# Patient Record
Sex: Male | Born: 1955 | Race: Black or African American | Hispanic: No | Marital: Married | Smoking: Current every day smoker
Health system: Southern US, Community
[De-identification: ages and names within clinical notes are randomized; demographics above are authoritative.]

## PROBLEM LIST (undated history)

## (undated) DIAGNOSIS — K3184 Gastroparesis: Secondary | ICD-10-CM

## (undated) HISTORY — PX: KNEE SURGERY: SHX244

## (undated) HISTORY — PX: KNEE ARTHROSCOPY: SHX127

---

## 2010-12-29 ENCOUNTER — Emergency Department (HOSPITAL_COMMUNITY)
Admission: EM | Admit: 2010-12-29 | Discharge: 2010-12-30 | Disposition: A | Discharge status: Home / Self Care | Payer: 59 | Attending: Emergency Medicine | Admitting: Emergency Medicine

## 2010-12-29 ENCOUNTER — Encounter: Payer: Self-pay | Admitting: Emergency Medicine

## 2010-12-29 DIAGNOSIS — R6883 Chills (without fever): Secondary | ICD-10-CM | POA: Insufficient documentation

## 2010-12-29 DIAGNOSIS — R5381 Other malaise: Secondary | ICD-10-CM | POA: Insufficient documentation

## 2010-12-29 DIAGNOSIS — M549 Dorsalgia, unspecified: Secondary | ICD-10-CM | POA: Insufficient documentation

## 2010-12-29 DIAGNOSIS — R05 Cough: Secondary | ICD-10-CM | POA: Insufficient documentation

## 2010-12-29 DIAGNOSIS — A084 Viral intestinal infection, unspecified: Secondary | ICD-10-CM

## 2010-12-29 DIAGNOSIS — R059 Cough, unspecified: Secondary | ICD-10-CM | POA: Insufficient documentation

## 2010-12-29 DIAGNOSIS — R10817 Generalized abdominal tenderness: Secondary | ICD-10-CM | POA: Insufficient documentation

## 2010-12-29 DIAGNOSIS — A088 Other specified intestinal infections: Secondary | ICD-10-CM | POA: Insufficient documentation

## 2010-12-29 NOTE — ED Notes (Signed)
Pt presented to teh Er with c/o abd and lower back pain, states s/s started couple of days ago, denies any injury to the affected area, 10/10, pt reports chills, cold sweats, nausea, vomiting, 3-4 times, pt states took some OTC medications to relieve the symptoms, without succses, pt very uncomfortable and jittery at this time. Denies any other issue or complaints.

## 2010-12-30 ENCOUNTER — Emergency Department (HOSPITAL_COMMUNITY): Payer: 59

## 2010-12-30 LAB — COMPREHENSIVE METABOLIC PANEL
ALT: 22 U/L (ref 0–53)
Albumin: 4 g/dL (ref 3.5–5.2)
Alkaline Phosphatase: 66 U/L (ref 39–117)
BUN: 10 mg/dL (ref 6–23)
Chloride: 100 mEq/L (ref 96–112)
Potassium: 3.5 mEq/L (ref 3.5–5.1)
Total Bilirubin: 0.4 mg/dL (ref 0.3–1.2)

## 2010-12-30 LAB — CBC
HCT: 37.8 % — ABNORMAL LOW (ref 39.0–52.0)
Hemoglobin: 13.5 g/dL (ref 13.0–17.0)
RDW: 14.1 % (ref 11.5–15.5)
WBC: 17.6 10*3/uL — ABNORMAL HIGH (ref 4.0–10.5)

## 2010-12-30 LAB — URINALYSIS, ROUTINE W REFLEX MICROSCOPIC
Glucose, UA: NEGATIVE mg/dL
Leukocytes, UA: NEGATIVE
Protein, ur: NEGATIVE mg/dL
pH: 7.5 (ref 5.0–8.0)

## 2010-12-30 LAB — URINE MICROSCOPIC-ADD ON

## 2010-12-30 MED ORDER — ONDANSETRON HCL 4 MG/2ML IJ SOLN
4.0000 mg | Freq: Once | INTRAMUSCULAR | Status: AC
  Administered 2010-12-30: 4 mg via INTRAVENOUS
  Filled 2010-12-30: qty 2

## 2010-12-30 MED ORDER — PROMETHAZINE HCL 25 MG PO TABS: 25.0000 mg | ORAL_TABLET | Freq: Four times a day (QID) | ORAL | Status: AC | PRN

## 2010-12-30 MED ORDER — ONDANSETRON HCL 4 MG/2ML IJ SOLN
4.0000 mg | Freq: Once | INTRAMUSCULAR | Status: AC
  Administered 2010-12-30: 4 mg via INTRAVENOUS

## 2010-12-30 MED ORDER — SODIUM CHLORIDE 0.9 % IV SOLN
Freq: Once | INTRAVENOUS | Status: AC
  Administered 2010-12-30: via INTRAVENOUS

## 2010-12-30 MED ORDER — SODIUM CHLORIDE 0.9 % IV BOLUS (SEPSIS)
1000.0000 mL | Freq: Once | INTRAVENOUS | Status: AC
  Administered 2010-12-30: 1000 mL via INTRAVENOUS

## 2010-12-30 MED ORDER — ONDANSETRON HCL 4 MG/2ML IJ SOLN
INTRAMUSCULAR | Status: AC
  Administered 2010-12-30: 4 mg via INTRAVENOUS
  Filled 2010-12-30: qty 2

## 2010-12-30 MED ORDER — MORPHINE SULFATE 4 MG/ML IJ SOLN
4.0000 mg | Freq: Once | INTRAMUSCULAR | Status: AC
  Administered 2010-12-30: 4 mg via INTRAVENOUS
  Filled 2010-12-30: qty 1

## 2010-12-30 MED ORDER — HYDROCODONE-ACETAMINOPHEN 5-325 MG PO TABS: 1.0000 | ORAL_TABLET | ORAL | Status: AC | PRN

## 2010-12-30 NOTE — ED Provider Notes (Signed)
History     CSN: [id number]  Arrival date & time 12/29/10  2214   First MD Initiated Contact with Patient 12/30/10 0214      Chief Complaint  Patient presents with  . Abdominal Pain  . Back Pain      Patient is a 55 y.o. male presenting with abdominal pain and back pain. The history is provided by the patient.  Abdominal Pain The primary symptoms of the illness include abdominal pain, fatigue, nausea, vomiting and diarrhea. The current episode started 2 days ago. The onset of the illness was sudden. The problem has been gradually worsening.  The patient has had a change in bowel habit. Additional symptoms associated with the illness include constipation and back pain. Symptoms associated with the illness do not include frequency.  Back Pain  Associated symptoms include abdominal pain.   patient reports 2 day history of flulike symptoms. Symptoms include nausea, vomiting, diarrhea, generalized abdominal pain and low back pain. States had chills, unknown whether or not he's had fever. States he's had 5-6 episodes of vomiting last 24 hours in 2-3 episodes diarrhea. Admits that he's been around several coworkers and family members with similar symptoms.  History reviewed. No pertinent past medical history.  Past Surgical History  Procedure Date  . Knee arthroscopy     History reviewed. No pertinent family history.  History  Substance Use Topics  . Smoking status: Current Everyday Smoker  . Smokeless tobacco: Not on file  . Alcohol Use: No      Review of Systems  Constitutional: Positive for fatigue.  HENT: Negative.   Eyes: Negative.   Respiratory: Negative.   Cardiovascular: Negative.   Gastrointestinal: Positive for nausea, vomiting, abdominal pain, diarrhea and constipation.  Genitourinary: Negative.  Negative for frequency.  Musculoskeletal: Positive for back pain.  Skin: Negative.   Neurological: Negative.   Hematological: Negative.   Psychiatric/Behavioral:  Negative.     Allergies  Review of patient's allergies indicates no known allergies.  Home Medications  No current outpatient prescriptions on file.  BP 145/85  Pulse 86  Temp(Src) 98.2 F (36.8 C) (Oral)  Resp 20  SpO2 99%  Physical Exam  Constitutional: He appears well-developed and well-nourished.  HENT:  Head: Normocephalic and atraumatic.  Eyes: Conjunctivae are normal.  Neck: Neck supple.  Cardiovascular: Normal rate and regular rhythm.   Pulmonary/Chest: Effort normal and breath sounds normal.  Abdominal: Soft. Bowel sounds are decreased. There is generalized tenderness.  Musculoskeletal: Normal range of motion.  Neurological: He is alert.  Skin: Skin is warm and dry.  Psychiatric: He has a normal mood and affect.    ED Course  Procedures findings and clinical impression discussed with patient. Patient admits to feeling much better after IV hydration and medication. There have been no further episodes of vomiting or diarrhea since arrival to the department. Will plan for discharge home with short course of medication for nausea and pain and encourage pt to return for worsening symptoms, otherwise follow up w/ a primary doctor of his choosing. Referrals provided.  Labs Reviewed  URINALYSIS, ROUTINE W REFLEX MICROSCOPIC - Abnormal; Notable for the following:    APPearance TURBID (*)    Ketones, ur 40 (*)    All other components within normal limits  CBC - Abnormal; Notable for the following:    WBC 17.6 (*)    RBC 4.05 (*)    HCT 37.8 (*)    Platelets 470 (*)    All other components within  normal limits  COMPREHENSIVE METABOLIC PANEL - Abnormal; Notable for the following:    Glucose, Bld 136 (*)    All other components within normal limits  URINE MICROSCOPIC-ADD ON - Abnormal; Notable for the following:    Bacteria, UA MANY (*)    All other components within normal limits   Dg Chest 2 View  12/30/2010  *RADIOLOGY REPORT*  Clinical Data: Chills, cough.  CHEST  - 2 VIEW  Comparison: None.  Findings: Mild interstitial prominence.  No focal consolidation. No pleural effusion or pneumothorax. Cardiomediastinal contours are within normal limits.  No acute osseous abnormality.  IMPRESSION: Mild interstitial prominence without focal consolidation.  Original Report Authenticated By: Waneta Martins, M.D.     No diagnosis found.    MDM  HPI/PE and findings and clinical course most c/w viral gastroenteritis.        Leanne Chang, NP 12/30/10 [phone]

## 2010-12-30 NOTE — ED Provider Notes (Signed)
Medical screening examination/treatment/procedure(s) were conducted as a shared visit with non-physician practitioner(s) and myself.  I personally evaluated the patient during the encounter   Joya Gaskins, MD 12/30/10 0800

## 2010-12-30 NOTE — ED Provider Notes (Signed)
BP 145/85  Pulse 86  Temp(Src) 98.2 F (36.8 C) (Oral)  Resp 20  SpO2 99% PT IMPROVED ABD SOFT, AND NO FOCAL TENDERNESS WE DISCUSSED STRICT RETURN PRECAUTIONS INCLUDING WORSENED PAIN OR PAIN THAT MIGRATES TO RLQ OVER NEXT 8 HOURS   Joya Gaskins, MD 12/30/10 0430

## 2011-01-03 ENCOUNTER — Emergency Department (HOSPITAL_COMMUNITY)
Admission: EM | Admit: 2011-01-03 | Discharge: 2011-01-03 | Disposition: A | Discharge status: Home / Self Care | Payer: 59 | Attending: Emergency Medicine | Admitting: Emergency Medicine

## 2011-01-03 ENCOUNTER — Emergency Department (HOSPITAL_COMMUNITY): Payer: 59

## 2011-01-03 ENCOUNTER — Encounter (HOSPITAL_COMMUNITY): Payer: Self-pay | Admitting: *Deleted

## 2011-01-03 DIAGNOSIS — R109 Unspecified abdominal pain: Secondary | ICD-10-CM | POA: Insufficient documentation

## 2011-01-03 DIAGNOSIS — R197 Diarrhea, unspecified: Secondary | ICD-10-CM | POA: Insufficient documentation

## 2011-01-03 DIAGNOSIS — F17200 Nicotine dependence, unspecified, uncomplicated: Secondary | ICD-10-CM | POA: Insufficient documentation

## 2011-01-03 DIAGNOSIS — R112 Nausea with vomiting, unspecified: Secondary | ICD-10-CM | POA: Insufficient documentation

## 2011-01-03 DIAGNOSIS — K529 Noninfective gastroenteritis and colitis, unspecified: Secondary | ICD-10-CM

## 2011-01-03 DIAGNOSIS — K5289 Other specified noninfective gastroenteritis and colitis: Secondary | ICD-10-CM | POA: Insufficient documentation

## 2011-01-03 LAB — COMPREHENSIVE METABOLIC PANEL
ALT: 21 U/L (ref 0–53)
Alkaline Phosphatase: 65 U/L (ref 39–117)
BUN: 15 mg/dL (ref 6–23)
Chloride: 101 mEq/L (ref 96–112)
GFR calc Af Amer: >90 mL/min (ref >90)
Glucose, Bld: 122 mg/dL — ABNORMAL HIGH (ref 70–99)
Potassium: 4.1 mEq/L (ref 3.5–5.1)
Sodium: 134 mEq/L — ABNORMAL LOW (ref 135–145)
Total Bilirubin: 0.6 mg/dL (ref 0.3–1.2)

## 2011-01-03 LAB — DIFFERENTIAL
Basophils Relative: 0 % (ref 0–1)
Eosinophils Relative: 0 % (ref 0–5)
Lymphocytes Relative: 19 % (ref 12–46)
Monocytes Relative: 10 % (ref 3–12)
Neutro Abs: 8.1 10*3/uL — ABNORMAL HIGH (ref 1.7–7.7)

## 2011-01-03 LAB — LIPASE, BLOOD: Lipase: 18 U/L (ref 11–59)

## 2011-01-03 LAB — CBC
HCT: 39.5 % (ref 39.0–52.0)
Hemoglobin: 14.1 g/dL (ref 13.0–17.0)
MCHC: 35.7 g/dL (ref 30.0–36.0)
RBC: 4.28 MIL/uL (ref 4.22–5.81)
WBC: 11.3 10*3/uL — ABNORMAL HIGH (ref 4.0–10.5)

## 2011-01-03 MED ORDER — HYDROMORPHONE HCL PF 1 MG/ML IJ SOLN
1.0000 mg | Freq: Once | INTRAMUSCULAR | Status: AC
  Administered 2011-01-03: 1 mg via INTRAVENOUS
  Filled 2011-01-03: qty 1

## 2011-01-03 MED ORDER — METOCLOPRAMIDE HCL 5 MG/ML IJ SOLN
INTRAMUSCULAR | Status: AC
  Administered 2011-01-03: 10 mg
  Filled 2011-01-03: qty 2

## 2011-01-03 MED ORDER — IOHEXOL 300 MG/ML  SOLN
100.0000 mL | Freq: Once | INTRAMUSCULAR | Status: AC | PRN
  Administered 2011-01-03: 100 mL via INTRAVENOUS

## 2011-01-03 MED ORDER — PANTOPRAZOLE SODIUM 40 MG IV SOLR
40.0000 mg | Freq: Once | INTRAVENOUS | Status: AC
  Administered 2011-01-03: 40 mg via INTRAVENOUS
  Filled 2011-01-03: qty 40

## 2011-01-03 MED ORDER — ONDANSETRON HCL 4 MG/2ML IJ SOLN
4.0000 mg | Freq: Once | INTRAMUSCULAR | Status: AC
Start: 2011-01-03 — End: 2011-01-03
  Administered 2011-01-03: 4 mg via INTRAVENOUS
  Filled 2011-01-03: qty 2

## 2011-01-03 MED ORDER — PANTOPRAZOLE SODIUM 20 MG PO TBEC: 20.0000 mg | DELAYED_RELEASE_TABLET | Freq: Every day | ORAL | Status: DC

## 2011-01-03 MED ORDER — HYDROCODONE-ACETAMINOPHEN 5-325 MG PO TABS: 1.0000 | ORAL_TABLET | Freq: Four times a day (QID) | ORAL | Status: AC | PRN

## 2011-01-03 MED ORDER — SODIUM CHLORIDE 0.9 % IV BOLUS (SEPSIS)
1000.0000 mL | Freq: Once | INTRAVENOUS | Status: AC
  Administered 2011-01-03: 1000 mL via INTRAVENOUS

## 2011-01-03 MED ORDER — PROMETHAZINE HCL 25 MG PO TABS: 25.0000 mg | ORAL_TABLET | Freq: Four times a day (QID) | ORAL | Status: AC | PRN

## 2011-01-03 NOTE — ED Notes (Signed)
Patient given discharge instructions, information, prescriptions, and diet order. Patient states that they adequately understand discharge information given and to return to ED if symptoms return or worsen.     

## 2011-01-03 NOTE — ED Notes (Signed)
Family at bedside. 

## 2011-01-03 NOTE — ED Notes (Signed)
Patient is resting comfortably. Eyes closed with deep, even respirations

## 2011-01-03 NOTE — ED Notes (Signed)
Pts wife reports pt was seen here last Thursday with same symptoms, dx with stomach virus. Sent home with pain and nausea meds. Sts was doing well until last night, symptoms returned, out of pain meds. Took nausea med this am, has vomited several times this am. C/o generalized abd pain.

## 2011-01-03 NOTE — ED Provider Notes (Signed)
History     CSN: [id number]  Arrival date & time 01/03/11  0920   First MD Initiated Contact with Patient 01/03/11 [phone]      Chief Complaint  Patient presents with  . Emesis  . Abdominal Pain    (Consider location/radiation/quality/duration/timing/severity/associated sxs/prior treatment) Patient is a 55 y.o. male presenting with abdominal pain. The history is provided by the patient (The patient states that he started with abdominal cramping and diarrhea with nausea today. He had a similar episode 3 days ago which has been improving with treatment. Patient started back on solid foods today and now has severe cramping).  Abdominal Pain The primary symptoms of the illness include abdominal pain and diarrhea. The primary symptoms of the illness do not include fever, fatigue or shortness of breath. The current episode started more than 2 days ago. The onset of the illness was gradual. The problem has not changed since onset. The abdominal pain began more than 2 days ago. The pain came on gradually. The abdominal pain has been unchanged since its onset. The abdominal pain is generalized. The abdominal pain does not radiate. The severity of the abdominal pain is 7/10. The abdominal pain is relieved by nothing.  Symptoms associated with the illness do not include hematuria, frequency or back pain.    History reviewed. No pertinent past medical history.  Past Surgical History  Procedure Date  . Knee arthroscopy     No family history on file.  History  Substance Use Topics  . Smoking status: Current Everyday Smoker  . Smokeless tobacco: Not on file  . Alcohol Use: No      Review of Systems  Constitutional: Negative for fever and fatigue.  HENT: Negative for congestion, sinus pressure and ear discharge.   Eyes: Negative for discharge.  Respiratory: Negative for cough and shortness of breath.   Cardiovascular: Negative for chest pain.  Gastrointestinal: Positive for abdominal  pain and diarrhea.  Genitourinary: Negative for frequency and hematuria.  Musculoskeletal: Negative for back pain.  Skin: Negative for rash.  Neurological: Negative for seizures and headaches.  Hematological: Negative.   Psychiatric/Behavioral: Negative for hallucinations.    Allergies  Review of patient's allergies indicates no known allergies.  Home Medications   Current Outpatient Rx  Name Route Sig Dispense Refill  . HYDROCODONE-ACETAMINOPHEN 5-325 MG PO TABS Oral Take 1 tablet by mouth every 4 (four) hours as needed for pain (1 to 2 tabs PO q4-6 h PRN pain). 15 tablet 0  . PROMETHAZINE HCL 25 MG PO TABS Oral Take 1 tablet (25 mg total) by mouth every 6 (six) hours as needed for nausea. 10 tablet 0  . HYDROCODONE-ACETAMINOPHEN 5-325 MG PO TABS Oral Take 1 tablet by mouth every 6 (six) hours as needed for pain. 20 tablet 0  . PANTOPRAZOLE SODIUM 20 MG PO TBEC Oral Take 1 tablet (20 mg total) by mouth daily. 30 tablet 0  . PROMETHAZINE HCL 25 MG PO TABS Oral Take 1 tablet (25 mg total) by mouth every 6 (six) hours as needed for nausea. 15 tablet 0    BP 142/93  Pulse 86  Temp(Src) 98.2 F (36.8 C) (Oral)  Resp 18  SpO2 96%  Physical Exam  Constitutional: He is oriented to person, place, and time. He appears well-developed.  HENT:  Head: Normocephalic and atraumatic.  Eyes: Conjunctivae and EOM are normal. No scleral icterus.  Neck: Neck supple. No thyromegaly present.  Cardiovascular: Normal rate and regular rhythm.  Exam reveals no  gallop and no friction rub.   No murmur heard. Pulmonary/Chest: No stridor. He has no wheezes. He has no rales. He exhibits no tenderness.  Abdominal: He exhibits distension. There is tenderness. There is no rebound.  Musculoskeletal: Normal range of motion. He exhibits no edema.  Lymphadenopathy:    He has no cervical adenopathy.  Neurological: He is oriented to person, place, and time. Coordination normal.  Skin: No rash noted. No erythema.   Psychiatric: He has a normal mood and affect. His behavior is normal.    ED Course  Procedures (including critical care time)  Labs Reviewed  CBC - Abnormal; Notable for the following:    WBC 11.3 (*)    Platelets 556 (*)    All other components within normal limits  DIFFERENTIAL - Abnormal; Notable for the following:    Neutro Abs 8.1 (*)    Monocytes Absolute 1.1 (*)    All other components within normal limits  COMPREHENSIVE METABOLIC PANEL - Abnormal; Notable for the following:    Sodium 134 (*)    Glucose, Bld 122 (*)    All other components within normal limits  LIPASE, BLOOD   Ct Abdomen Pelvis W Contrast  01/03/2011  *RADIOLOGY REPORT*  Clinical Data: Abdominal pain.  Vomiting.  CT ABDOMEN AND PELVIS WITH CONTRAST  Technique:  Multidetector CT imaging of the abdomen and pelvis was performed following the standard protocol during bolus administration of intravenous contrast.  Contrast: OMNIPAQUE IOHEXOL 300 MG/ML IV SOLN  Comparison: None.  Findings: The abdominal parenchymal organs are normal in appearance.  Gallbladder is unremarkable.  No evidence of hydronephrosis.  No soft tissue masses or lymphadenopathy identified within the abdomen or pelvis.  There is no evidence of inflammatory process or abnormal fluid collections.  Normal appendix is visualized.  No evidence of bowel wall thickening or dilatation.  IMPRESSION: Negative.  No acute findings or other significant abnormality.  Original Report Authenticated By: Danae Orleans, M.D.     1. Gastroenteritis       MDM          Benny Lennert, MD 01/03/11 [phone]

## 2011-04-03 ENCOUNTER — Emergency Department (HOSPITAL_COMMUNITY)
Admission: EM | Admit: 2011-04-03 | Discharge: 2011-04-03 | Disposition: A | Discharge status: Home / Self Care | Payer: 59 | Attending: Emergency Medicine | Admitting: Emergency Medicine

## 2011-04-03 ENCOUNTER — Encounter (HOSPITAL_COMMUNITY): Payer: Self-pay | Admitting: *Deleted

## 2011-04-03 DIAGNOSIS — K5289 Other specified noninfective gastroenteritis and colitis: Secondary | ICD-10-CM | POA: Insufficient documentation

## 2011-04-03 DIAGNOSIS — K529 Noninfective gastroenteritis and colitis, unspecified: Secondary | ICD-10-CM

## 2011-04-03 DIAGNOSIS — R5381 Other malaise: Secondary | ICD-10-CM | POA: Insufficient documentation

## 2011-04-03 HISTORY — DX: Gastroparesis: K31.84

## 2011-04-03 LAB — COMPREHENSIVE METABOLIC PANEL
ALT: 23 U/L (ref 0–53)
Calcium: 9.2 mg/dL (ref 8.4–10.5)
GFR calc Af Amer: >90 mL/min (ref >90)
Glucose, Bld: 127 mg/dL — ABNORMAL HIGH (ref 70–99)
Sodium: 136 mEq/L (ref 135–145)
Total Protein: 7.9 g/dL (ref 6.0–8.3)

## 2011-04-03 MED ORDER — SODIUM CHLORIDE 0.9 % IV BOLUS (SEPSIS)
2000.0000 mL | Freq: Once | INTRAVENOUS | Status: AC
  Administered 2011-04-03 (×2): 1000 mL via INTRAVENOUS

## 2011-04-03 MED ORDER — HYDROMORPHONE HCL PF 1 MG/ML IJ SOLN
1.0000 mg | Freq: Once | INTRAMUSCULAR | Status: AC
  Administered 2011-04-03: 1 mg via INTRAVENOUS
  Filled 2011-04-03: qty 1

## 2011-04-03 MED ORDER — ONDANSETRON HCL 4 MG PO TABS: 8.0000 mg | ORAL_TABLET | Freq: Two times a day (BID) | ORAL | Status: AC | PRN

## 2011-04-03 MED ORDER — ONDANSETRON HCL 4 MG/2ML IJ SOLN
4.0000 mg | Freq: Once | INTRAMUSCULAR | Status: AC
  Administered 2011-04-03: 4 mg via INTRAVENOUS
  Filled 2011-04-03: qty 2

## 2011-04-03 NOTE — ED Notes (Signed)
2nd liter of normal saline hung. Will notify Md when completed. Family at bedside. Will continue to monitor pt.

## 2011-04-03 NOTE — Discharge Instructions (Signed)
Drink lots of fluids avoid milk or foods containing milk such as ice cream while having diarrhea. You can take Imodium A-D as directed for diarrhea. Take the medication prescribed as needed for nausea. Return or see your doctor if unable to hold down fluids after taking medicine prescribed, return if her condition worsens for any reason

## 2011-04-03 NOTE — Progress Notes (Signed)
Pt confirms he does not have a PCP Has [employer] coverage Can find in network [facility]

## 2011-04-03 NOTE — ED Notes (Signed)
Pt reports generalized abd pain that began this am, feels similar to previous episode of gastroparesis. Unable to count episodes of vomiting in last 12 hours. A few episodes of diarrhea as well.

## 2011-04-03 NOTE — ED Provider Notes (Signed)
History     CSN: [id number]  Arrival date & time 04/03/11  1505   First MD Initiated Contact with Patient 04/03/11 1648      Chief Complaint  Patient presents with  . Emesis  . Nausea  . Abdominal Pain    (Consider location/radiation/quality/duration/timing/severity/associated sxs/prior treatment) HPI Complains of diffuse sharp abdominal pain gradual onset 3 AM today accompanied by multiple episodes of vomiting and multiple episodes of diarrhea. Pain is constant nothing makes symptoms better or worse. No fever no blood per rectum no hematemesis no treatment prior to coming here. Feels dehydrated No other associated symptoms Past Medical History  Diagnosis Date  . Gastroparesis    gastroenteritis  Past Surgical History  Procedure Date  . Knee arthroscopy   . Knee surgery     No family history on file.  History  Substance Use Topics  . Smoking status: Current Everyday Smoker  . Smokeless tobacco: Not on file  . Alcohol Use: Yes     occasionally      Review of Systems  Constitutional: Positive for fatigue.  Gastrointestinal: Positive for nausea, vomiting, abdominal pain and diarrhea.  All other systems reviewed and are negative.    Allergies  Review of patient's allergies indicates no known allergies.  Home Medications   Current Outpatient Rx  Name Route Sig Dispense Refill  . PROMETHAZINE HCL 25 MG PO TABS Oral Take 25 mg by mouth every 6 (six) hours as needed. For nausea      BP 180/94  Pulse 63  Temp(Src) 97.9 F (36.6 C) (Oral)  Resp 22  SpO2 100%  Physical Exam  Nursing note and vitals reviewed. Constitutional: He appears well-developed and well-nourished.  HENT:  Head: Normocephalic and atraumatic.       Mucous membranes dry  Eyes: Conjunctivae are normal. Pupils are equal, round, and reactive to light.  Neck: Neck supple. No tracheal deviation present. No thyromegaly present.  Cardiovascular: Normal rate and regular rhythm.   No murmur  heard. Pulmonary/Chest: Effort normal and breath sounds normal.  Abdominal: Soft. Bowel sounds are normal. He exhibits no distension. There is no tenderness.  Musculoskeletal: Normal range of motion. He exhibits no edema and no tenderness.  Neurological: He is alert. Coordination normal.  Skin: Skin is warm and dry. No rash noted.  Psychiatric: He has a normal mood and affect.    ED Course  Procedures (including critical care time)  Labs Reviewed - No data to display No results found. Results for orders placed during the hospital encounter of 04/03/11  COMPREHENSIVE METABOLIC PANEL      Component Value Range   Sodium 136  135 - 145 (mEq/L)   Potassium 3.5  3.5 - 5.1 (mEq/L)   Chloride 101  96 - 112 (mEq/L)   CO2 25  19 - 32 (mEq/L)   Glucose, Bld 127 (*) 70 - 99 (mg/dL)   BUN 17  6 - 23 (mg/dL)   Creatinine, Ser 4.09  0.50 - 1.35 (mg/dL)   Calcium 9.2  8.4 - 81.1 (mg/dL)   Total Protein 7.9  6.0 - 8.3 (g/dL)   Albumin 4.0  3.5 - 5.2 (g/dL)   AST 28  0 - 37 (U/L)   ALT 23  0 - 53 (U/L)   Alkaline Phosphatase 75  39 - 117 (U/L)   Total Bilirubin 0.5  0.3 - 1.2 (mg/dL)   GFR calc non Af Amer >90  >90 (mL/min)   GFR calc Af Amer >90  >90 (  mL/min)   No results found.   No diagnosis found.  5:25 PM feels much improved after treatment with narcotic pain medicine and Zofran and intravenous fluids 10:20 PM patient feels ready to home MDM  Plan prescriptions Zofran,imodiumad prn diarrhea Encourage by mouth fluids Dx#1. Gastroenteritis #2 dehydration        Doug Sou, MD 04/03/11 2224

## 2011-04-05 ENCOUNTER — Emergency Department (HOSPITAL_COMMUNITY)
Admission: EM | Admit: 2011-04-05 | Discharge: 2011-04-05 | Disposition: A | Discharge status: Home / Self Care | Payer: 59 | Attending: Emergency Medicine | Admitting: Emergency Medicine

## 2011-04-05 ENCOUNTER — Encounter (HOSPITAL_COMMUNITY): Payer: Self-pay

## 2011-04-05 DIAGNOSIS — R109 Unspecified abdominal pain: Secondary | ICD-10-CM

## 2011-04-05 DIAGNOSIS — R1110 Vomiting, unspecified: Secondary | ICD-10-CM | POA: Insufficient documentation

## 2011-04-05 DIAGNOSIS — R10819 Abdominal tenderness, unspecified site: Secondary | ICD-10-CM | POA: Insufficient documentation

## 2011-04-05 DIAGNOSIS — I10 Essential (primary) hypertension: Secondary | ICD-10-CM | POA: Insufficient documentation

## 2011-04-05 LAB — POCT I-STAT, CHEM 8
BUN: 17 mg/dL (ref 6–23)
Chloride: 106 mEq/L (ref 96–112)
Creatinine, Ser: 1.2 mg/dL (ref 0.50–1.35)
Glucose, Bld: 120 mg/dL — ABNORMAL HIGH (ref 70–99)
Hemoglobin: 16 g/dL (ref 13.0–17.0)
Potassium: 3.8 mEq/L (ref 3.5–5.1)

## 2011-04-05 LAB — CBC
HCT: 44.1 % (ref 39.0–52.0)
Hemoglobin: 15.2 g/dL (ref 13.0–17.0)
MCHC: 34.5 g/dL (ref 30.0–36.0)

## 2011-04-05 MED ORDER — ONDANSETRON 4 MG PO TBDP: 4.0000 mg | ORAL_TABLET | Freq: Three times a day (TID) | ORAL | Status: AC | PRN

## 2011-04-05 MED ORDER — OXYCODONE-ACETAMINOPHEN 5-325 MG PO TABS: 1.0000 | ORAL_TABLET | ORAL | Status: AC | PRN

## 2011-04-05 MED ORDER — HYDROMORPHONE HCL PF 1 MG/ML IJ SOLN
1.0000 mg | Freq: Once | INTRAMUSCULAR | Status: AC
  Administered 2011-04-05: 1 mg via INTRAVENOUS
  Filled 2011-04-05: qty 1

## 2011-04-05 MED ORDER — SODIUM CHLORIDE 0.9 % IV BOLUS (SEPSIS)
1000.0000 mL | Freq: Once | INTRAVENOUS | Status: AC
  Administered 2011-04-05 (×2): 1000 mL via INTRAVENOUS

## 2011-04-05 MED ORDER — HYDROMORPHONE HCL PF 1 MG/ML IJ SOLN
INTRAMUSCULAR | Status: AC
  Filled 2011-04-05: qty 1

## 2011-04-05 MED ORDER — ONDANSETRON HCL 4 MG/2ML IJ SOLN
4.0000 mg | Freq: Once | INTRAMUSCULAR | Status: AC
  Administered 2011-04-05: 4 mg via INTRAVENOUS
  Filled 2011-04-05: qty 2

## 2011-04-05 MED ORDER — SODIUM CHLORIDE 0.9 % IV BOLUS (SEPSIS)
1000.0000 mL | Freq: Once | INTRAVENOUS | Status: AC
  Administered 2011-04-05: 1000 mL via INTRAVENOUS

## 2011-04-05 MED ORDER — HYDROMORPHONE HCL PF 1 MG/ML IJ SOLN
1.0000 mg | Freq: Once | INTRAMUSCULAR | Status: AC
  Administered 2011-04-05: 1 mg via INTRAVENOUS

## 2011-04-05 MED ORDER — ENALAPRILAT 1.25 MG/ML IV SOLN
1.2500 mg | Freq: Once | INTRAVENOUS | Status: AC
  Administered 2011-04-05: 1.25 mg via INTRAVENOUS
  Filled 2011-04-05: qty 1

## 2011-04-05 MED ORDER — PROMETHAZINE HCL 25 MG PO TABS: 25.0000 mg | ORAL_TABLET | Freq: Four times a day (QID) | ORAL | Status: AC | PRN

## 2011-04-05 NOTE — Discharge Instructions (Signed)
Your blood pressure has been elevated and required a blood pressure medicine today - please continue to take the Lisinopril until you follow up with your doctor this week.  If you don't have a doctor see the list below - return to ER for recheck if you are not improving or worsening.  You have been diagnosed with undifferentiated abdominal pain.  Abdominal pain can be caused by many things. Your caregiver evaluates the seriousness of your pain by an examination and possibly blood or urine tests and imaging (CT scan, x-rays, ultrasound). Many cases can be observed and treated at home after initial evaluation in the emergency department. Even though you are being discharged home, abdominal pain can be unpredictable. Therefore, you need a repeat exam if your pain does not resolve, returns, or worsens. Most patient's with abdominal pain do not need to be admitted to the hospital or have surgery, but serious problems like appendicitis and gallbladder attacks can start out as nonspecific pain. Many abdominal conditions cannot be diagnosed in 1 visit, so followup evaluations are very important.  Seek immediate medical attention if:  *The pain does not go away or becomes severe. *Temperature above 101 develops *Repeated vomiting occurs(multiple episodes) *The pain becomes localized to portions of the abdomen. The right side could possibly be appendicitis. In an adult, the left lower portion of the abdomen could be colitis or diverticulitis. *Blood is being passed in stools or vomit *Return also if you develop chest pain, difficulty breathing, dizziness or fainting, or become confused poorly responsive or inconsolable (young children).     If you do not have a physician, you should reference the below phone numbers and call in the morning to establish follow up care.  RESOURCE GUIDE  Dental Problems  Patients with Medicaid: [facility] [phone]                                           [phone] [address] Phone:  [phone]                                                  Phone:  [phone]  If unable to pay or uninsured, contact:  [facility] or [facility]. to become qualified for the adult dental clinic.  Chronic Pain Problems Contact [facility]  [phone] Patients need to be referred by their primary care doctor.  Insufficient Money for Medicine Contact [employer]:  call "211" or [facility] ([phone] [phone].  No Primary Care Doctor Call [facility]  [phone] Other agencies that provide inexpensive medical care    [facility]  [phone]    [facility]  [phone]    [facility]  [phone]    [facility]  [phone]    [facility]  3134157994    [facility]  [phone]  Psychological Services [facility]  [phone] [facility]  [phone] [facility]   3202611858 (emergency services 5157534792)  Substance Abuse Resources [facility]  [phone] [facility]  239-632-2448 [facility] 5790253802 Floydene Flock [phone] Residential & Outpatient Substance Abuse Program  5401306937  Abuse/Neglect [facility] Child Abuse Hotline (720) 256-3521 [facility] Child Abuse Hotline 5794119947 (After Hours)  Emergency Shelter [facility] [phone]  M[facility] [place] [facility] (680)811-3669 [facility] (616)356-6833  MRSA Hotline #:   (250)040-5906    [facility] Resources  [facility]     [employer]                          [facility] Dept. [address] [place]                       [address]      [address]  Blondell Reveal Phone:   [phone]                                   Phone:  [phone]                 Phone:  [phone]  [facility] Phone:  ([phone]  [facility] Child Abuse Hotline 734-519-8597 [phone] (After Hours)

## 2011-04-05 NOTE — ED Provider Notes (Signed)
Patient was reassessed and feeling better. He was discharged home in good condition with discharge instructions are to be completed by Dr. Hyacinth Meeker.  1. Abdominal  pain, other specified site   2. Vomiting   3. Hypertension      Cyndra Numbers, MD 04/05/11 [phone]

## 2011-04-05 NOTE — ED Provider Notes (Signed)
History     CSN: [id number]  Arrival date & time 04/05/11  5621   First MD Initiated Contact with Patient 04/05/11 [phone]      Chief Complaint  Patient presents with  . Emesis    (Consider location/radiation/quality/duration/timing/severity/associated sxs/prior treatment) HPI Comments: Mario Woodard 56 y.o. male   Chief Complaint: Abdominal Pain  Information is obtained from : patient, spouse/SO and past medical records  Onset of the pain was gradual, Pain is located at diffuse abdomen, Timing persistent, Course worsened, Severity moderate, , Quality is aching and cramping, Alleviating/Agravating Their symptoms are aggravated by palpation., Symptoms improve with none., Patient notes no abdominal pain, diarrhea, nausea and vomiting. and Past history includes recent divertiulitis., No significant heartburn, No hematemesis, No blood in stools or black tarry stools, No dysphagia, Positive for diarrhea watery, non bloody, vomiting twice this evening   Prior Surgery: no surgeries  Use of Steroids No Use of NSAIDs No Alcohol intake No   Seen in the ED 2 night ago and has improvement in sx of n/v/d adn cramping - had good day the next day and then it returned last night.    Patient is a 56 y.o. male presenting with vomiting. The history is provided by the patient, a relative and medical records.  Emesis     Past Medical History  Diagnosis Date  . Gastroparesis     Past Surgical History  Procedure Date  . Knee arthroscopy   . Knee surgery     History reviewed. No pertinent family history.  History  Substance Use Topics  . Smoking status: Current Everyday Smoker  . Smokeless tobacco: Not on file  . Alcohol Use: Yes     occasionally      Review of Systems  Gastrointestinal: Positive for vomiting.  All other systems reviewed and are negative.    Allergies  Review of patient's allergies indicates no known allergies.  Home Medications   Current Outpatient Rx    Name Route Sig Dispense Refill  . ONDANSETRON HCL 4 MG PO TABS Oral Take 2 tablets (8 mg total) by mouth every 12 (twelve) hours as needed for nausea. 20 tablet 0  . PROMETHAZINE HCL 25 MG PO TABS Oral Take 25 mg by mouth every 6 (six) hours as needed. For nausea      BP 180/108  Pulse 88  Temp(Src) 97.7 F (36.5 C) (Oral)  Resp 18  SpO2 100%  Physical Exam  Nursing note and vitals reviewed. Constitutional: He appears well-developed and well-nourished.       Uncomfortable appearing  HENT:  Head: Normocephalic and atraumatic.  Mouth/Throat: No oropharyngeal exudate.       MM mildly dehydrated  Eyes: Conjunctivae and EOM are normal. Pupils are equal, round, and reactive to light. Right eye exhibits no discharge. Left eye exhibits no discharge. No scleral icterus.  Neck: Normal range of motion. Neck supple. No JVD present. No thyromegaly present.  Cardiovascular: Normal rate, regular rhythm, normal heart sounds and intact distal pulses.  Exam reveals no gallop and no friction rub.   No murmur heard. Pulmonary/Chest: Effort normal and breath sounds normal. No respiratory distress. He has no wheezes. He has no rales.  Abdominal: Soft. He exhibits no distension and no mass. There is tenderness ( mild diffuse ttp without gurading, rebound or peritoneal signs).       Increased BS  Musculoskeletal: Normal range of motion. He exhibits no edema and no tenderness.  Lymphadenopathy:    He has no  cervical adenopathy.  Neurological: He is alert. Coordination normal.  Skin: Skin is warm and dry. No rash noted. No erythema.  Psychiatric: He has a normal mood and affect. His behavior is normal.    ED Course  Procedures (including critical care time)   Labs Reviewed  CBC   No results found.   No diagnosis found.    MDM  abd cramping and recurrent n/v/d.  Check labs, ua for hydration status, IVF, zofran, Dilaudid.  Pt has been reevaluated - abd with mild pain - has improved with  meds, has fluids given, 2nd liter of IVF being given at this time.  Pt has had persistently elevated BP - is currently 178 / 108 - he has no hx of htn - will start meds - ACE inhibitor, monitor for improvement, and anticipate d/c with f/u.  Change of shift - care signed out to Dr. Alto Denver.        Vida Roller, MD 04/05/11 [phone]

## 2011-04-05 NOTE — ED Notes (Signed)
Pt was seen here on Monday and was treated and discharged, did ok on Tuesday, sore from previous vomiting, started vomiting again tonight, pt currently vomiting in triage

## 2011-05-01 ENCOUNTER — Encounter: Payer: Self-pay | Admitting: Internal Medicine

## 2011-05-01 ENCOUNTER — Ambulatory Visit (INDEPENDENT_AMBULATORY_CARE_PROVIDER_SITE_OTHER): Payer: 59 | Admitting: Internal Medicine

## 2011-05-01 DIAGNOSIS — Z0000 Encounter for general adult medical examination without abnormal findings: Secondary | ICD-10-CM

## 2011-05-01 DIAGNOSIS — Z23 Encounter for immunization: Secondary | ICD-10-CM

## 2011-05-01 MED ORDER — AMLODIPINE BESYLATE 5 MG PO TABS: 5.0000 mg | ORAL_TABLET | Freq: Every day | ORAL | Status: DC

## 2011-05-01 NOTE — Patient Instructions (Addendum)
Come back fasting: FLP, CMP, TSH, CBC, PSA--- dx v70 Start your blood pressure medication Check the  blood pressure 2 or 3 times a week, be sure it is between 110/60 and 140/85. If it is consistently higher or lower , let me know

## 2011-05-01 NOTE — Progress Notes (Signed)
  Subjective:    Patient ID: Mario Woodard, male    DOB: 1955-02-15, 56 y.o.   MRN: [id number]  HPI New pt, CPX, here w/ his wife   Past medical history Elevated BP dx 03-2011  Past surgical history Arthroscopy, R knee ~ 2000  Social history Married, children x 2, one with current wife, 1 G child  Tobacco--  ~ 1ppd since ~ 1990 ETOH-- socially  Job-- Health visitor , [employer], works nights Diet-- regular to healthy  Exercise -- very active at work  Family history Diabetes-- M HTN-- M Asthma-- M CAD-- no Stroke-- no Colon cancer-- no Prostate cancer-- F dx at age 2   Review of Systems  Constitutional: Negative for fever.       Low sex drive lately , some fatigue after a GI infex 03-2011   Respiratory: Negative for cough and shortness of breath.   Cardiovascular: Negative for chest pain, palpitations and leg swelling.  Genitourinary: Negative for dysuria and hematuria.  Musculoskeletal:       L shoulder-neck pain ~ 2 weeks ago, no SSCP  Psychiatric/Behavioral:       Some stress d/t issues w/ daughter . No depression-anxiety       Objective:   Physical Exam General -- alert, well-developed, and well-nourished.   Neck --no thyromegaly , normal carotid pulse Lungs -- normal respiratory effort, no intercostal retractions, no accessory muscle use, and normal breath sounds.   Heart-- normal rate, regular rhythm, no murmur, and no gallop.   Abdomen--soft, non-tender, no distention, no masses, no HSM, no guarding, and no rigidity.   Extremities-- no pretibial edema bilaterally Rectal-- No external abnormalities noted. Normal sphincter tone. No rectal masses or tenderness. Brown stool, Hemoccult negative Prostate:  Prostate gland firm and smooth, no enlargement, nodularity, tenderness, mass, asymmetry or induration. Neurologic-- alert & oriented X3 and strength normal in all extremities. Psych-- Cognition and judgment appear intact. Alert and cooperative with normal  attention span and concentration.  not anxious appearing and not depressed appearing.      Assessment & Plan:

## 2011-05-01 NOTE — Assessment & Plan Note (Addendum)
Tdap  today Pneumonia shot today (smoker) Discuss with patient colon cancer screening, I have provided him w/ IFOB but will call if interested in a colonoscopy. Diet and exercise discussed Labs Risks of tobacco discussed, available help includes nicotine supplementation, Chantix, free seminars in the hospital.

## 2011-05-02 ENCOUNTER — Encounter: Payer: Self-pay | Admitting: Internal Medicine

## 2011-05-02 NOTE — Assessment & Plan Note (Signed)
BPs have been consistently elevated, see BP readings. Plan: Amlodipine 5 mg, low salt diet  see instructions goal 120/80

## 2011-05-05 ENCOUNTER — Other Ambulatory Visit (INDEPENDENT_AMBULATORY_CARE_PROVIDER_SITE_OTHER): Payer: 59

## 2011-05-05 DIAGNOSIS — Z0000 Encounter for general adult medical examination without abnormal findings: Secondary | ICD-10-CM

## 2011-05-05 LAB — CBC WITH DIFFERENTIAL/PLATELET
Basophils Absolute: 0 10*3/uL (ref 0.0–0.1)
Basophils Relative: 0.2 % (ref 0.0–3.0)
Eosinophils Absolute: 0.2 10*3/uL (ref 0.0–0.7)
HCT: 41.6 % (ref 39.0–52.0)
Hemoglobin: 13.9 g/dL (ref 13.0–17.0)
Lymphs Abs: 2.5 10*3/uL (ref 0.7–4.0)
MCHC: 33.4 g/dL (ref 30.0–36.0)
Monocytes Relative: 12.2 % — ABNORMAL HIGH (ref 3.0–12.0)
Neutro Abs: 4.8 10*3/uL (ref 1.4–7.7)
RBC: 4.21 Mil/uL — ABNORMAL LOW (ref 4.22–5.81)
RDW: 14.1 % (ref 11.5–14.6)

## 2011-05-05 LAB — LIPID PANEL
HDL: 41.8 mg/dL (ref >39.00)
LDL Cholesterol: 107 mg/dL — ABNORMAL HIGH (ref 0–99)
Total CHOL/HDL Ratio: 4
VLDL: 10.2 mg/dL (ref 0.0–40.0)

## 2011-05-05 LAB — COMPREHENSIVE METABOLIC PANEL
ALT: 24 U/L (ref 0–53)
Alkaline Phosphatase: 80 U/L (ref 39–117)
Sodium: 143 mEq/L (ref 135–145)
Total Bilirubin: 0.5 mg/dL (ref 0.3–1.2)
Total Protein: 6.8 g/dL (ref 6.0–8.3)

## 2011-05-09 ENCOUNTER — Other Ambulatory Visit: Payer: 59

## 2011-05-09 ENCOUNTER — Encounter: Payer: Self-pay | Admitting: *Deleted

## 2011-05-09 DIAGNOSIS — Z1211 Encounter for screening for malignant neoplasm of colon: Secondary | ICD-10-CM

## 2011-05-10 ENCOUNTER — Encounter: Payer: Self-pay | Admitting: *Deleted

## 2011-08-02 ENCOUNTER — Ambulatory Visit: Payer: 59 | Admitting: Internal Medicine

## 2014-03-19 ENCOUNTER — Encounter (HOSPITAL_COMMUNITY): Payer: Self-pay | Admitting: Emergency Medicine

## 2014-03-19 ENCOUNTER — Emergency Department (HOSPITAL_COMMUNITY)
Admission: EM | Admit: 2014-03-19 | Discharge: 2014-03-19 | Disposition: A | Discharge status: Home / Self Care | Payer: 59 | Attending: Emergency Medicine | Admitting: Emergency Medicine

## 2014-03-19 ENCOUNTER — Emergency Department (HOSPITAL_COMMUNITY): Payer: 59

## 2014-03-19 DIAGNOSIS — R1084 Generalized abdominal pain: Secondary | ICD-10-CM | POA: Insufficient documentation

## 2014-03-19 DIAGNOSIS — R112 Nausea with vomiting, unspecified: Secondary | ICD-10-CM | POA: Insufficient documentation

## 2014-03-19 DIAGNOSIS — Z720 Tobacco use: Secondary | ICD-10-CM | POA: Insufficient documentation

## 2014-03-19 DIAGNOSIS — Z79899 Other long term (current) drug therapy: Secondary | ICD-10-CM | POA: Insufficient documentation

## 2014-03-19 DIAGNOSIS — R109 Unspecified abdominal pain: Secondary | ICD-10-CM

## 2014-03-19 LAB — CBC WITH DIFFERENTIAL/PLATELET
BASOS ABS: 0 10*3/uL (ref 0.0–0.1)
BASOS PCT: 0 % (ref 0–1)
EOS PCT: 0 % (ref 0–5)
Eosinophils Absolute: 0 10*3/uL (ref 0.0–0.7)
HEMATOCRIT: 42 % (ref 39.0–52.0)
HEMOGLOBIN: 14.5 g/dL (ref 13.0–17.0)
LYMPHS ABS: 1.1 10*3/uL (ref 0.7–4.0)
Lymphocytes Relative: 13 % (ref 12–46)
MCH: 33.2 pg (ref 26.0–34.0)
MCHC: 34.5 g/dL (ref 30.0–36.0)
MCV: 96.1 fL (ref 78.0–100.0)
Monocytes Absolute: 0.8 10*3/uL (ref 0.1–1.0)
Monocytes Relative: 9 % (ref 3–12)
NEUTROS PCT: 78 % — AB (ref 43–77)
Neutro Abs: 6.8 10*3/uL (ref 1.7–7.7)
Platelets: 327 10*3/uL (ref 150–400)
RBC: 4.37 MIL/uL (ref 4.22–5.81)
RDW: 14.2 % (ref 11.5–15.5)
WBC: 8.7 10*3/uL (ref 4.0–10.5)

## 2014-03-19 LAB — COMPREHENSIVE METABOLIC PANEL
ALBUMIN: 4.2 g/dL (ref 3.5–5.2)
ALT: 18 U/L (ref 0–53)
AST: 25 U/L (ref 0–37)
Alkaline Phosphatase: 68 U/L (ref 39–117)
Anion gap: 9 (ref 5–15)
BUN: 12 mg/dL (ref 6–23)
CALCIUM: 9.1 mg/dL (ref 8.4–10.5)
CO2: 24 mmol/L (ref 19–32)
CREATININE: 1.07 mg/dL (ref 0.50–1.35)
Chloride: 108 mmol/L (ref 96–112)
GFR calc Af Amer: 87 mL/min — ABNORMAL LOW (ref >90)
GFR calc non Af Amer: 75 mL/min — ABNORMAL LOW (ref >90)
GLUCOSE: 141 mg/dL — AB (ref 70–99)
Potassium: 3.8 mmol/L (ref 3.5–5.1)
Sodium: 141 mmol/L (ref 135–145)
Total Bilirubin: 0.5 mg/dL (ref 0.3–1.2)
Total Protein: 7.6 g/dL (ref 6.0–8.3)

## 2014-03-19 LAB — LIPASE, BLOOD: LIPASE: 13 U/L (ref 11–59)

## 2014-03-19 MED ORDER — IOHEXOL 300 MG/ML  SOLN
100.0000 mL | Freq: Once | INTRAMUSCULAR | Status: AC | PRN
  Administered 2014-03-19: 100 mL via INTRAVENOUS

## 2014-03-19 MED ORDER — OXYCODONE-ACETAMINOPHEN 5-325 MG PO TABS: ORAL_TABLET | ORAL | Status: DC

## 2014-03-19 MED ORDER — ONDANSETRON HCL 4 MG/2ML IJ SOLN
4.0000 mg | INTRAMUSCULAR | Status: AC | PRN
  Administered 2014-03-19 (×2): 4 mg via INTRAVENOUS
  Filled 2014-03-19 (×2): qty 2

## 2014-03-19 MED ORDER — SODIUM CHLORIDE 0.9 % IV SOLN
INTRAVENOUS | Status: DC
  Administered 2014-03-19: 09:00:00 via INTRAVENOUS

## 2014-03-19 MED ORDER — MORPHINE SULFATE 4 MG/ML IJ SOLN
4.0000 mg | INTRAMUSCULAR | Status: DC | PRN
  Administered 2014-03-19: 4 mg via INTRAVENOUS
  Filled 2014-03-19: qty 1

## 2014-03-19 MED ORDER — ONDANSETRON HCL 4 MG PO TABS: 4.0000 mg | ORAL_TABLET | Freq: Three times a day (TID) | ORAL | Status: DC | PRN

## 2014-03-19 MED ORDER — IOHEXOL 300 MG/ML  SOLN
50.0000 mL | Freq: Once | INTRAMUSCULAR | Status: AC | PRN
Start: 2014-03-19 — End: 2014-03-19
  Administered 2014-03-19: 50 mL via ORAL

## 2014-03-19 MED ORDER — FAMOTIDINE IN NACL 20-0.9 MG/50ML-% IV SOLN
20.0000 mg | Freq: Once | INTRAVENOUS | Status: AC
  Administered 2014-03-19: 20 mg via INTRAVENOUS
  Filled 2014-03-19: qty 50

## 2014-03-19 MED ORDER — DICYCLOMINE HCL 10 MG/ML IM SOLN
20.0000 mg | Freq: Once | INTRAMUSCULAR | Status: AC
  Administered 2014-03-19: 20 mg via INTRAMUSCULAR
  Filled 2014-03-19: qty 2

## 2014-03-19 MED ORDER — HYDROMORPHONE HCL 1 MG/ML IJ SOLN
1.0000 mg | Freq: Once | INTRAMUSCULAR | Status: AC
  Administered 2014-03-19: 1 mg via INTRAVENOUS
  Filled 2014-03-19: qty 1

## 2014-03-19 MED ORDER — SODIUM CHLORIDE 0.9 % IV BOLUS (SEPSIS)
500.0000 mL | Freq: Once | INTRAVENOUS | Status: AC
  Administered 2014-03-19: 500 mL via INTRAVENOUS

## 2014-03-19 NOTE — ED Notes (Signed)
Pt states that he has been having gen body aches, cough, gen abd pain, emesis x 2 days.  Has been taking theraflu at home without relief.

## 2014-03-19 NOTE — ED Notes (Signed)
Patient presents to the ED from home where he lives with his wife with complaints of nausea/vomiting and abdominal pain since yesterday afternoon.  Patient states nothing makes the vomiting and pain worse, but the abdominal pain does feel some better when he is laying on his side.  Patient rates pain 10/10.  Patient also c/o diffuse generalized body aches.  Patient also c/o cough productive of sputum for 3 days.  Patient endorses subjective fever.  On exam, patients lung sounds are clear in all lobes.  No wheezing.  Heart sounds WNL, S1/S2, no gallop or rub.  +2 radial and pedal pulses.  No pre-tibial and pedal edema.  Patients abdomen is soft, but he states discomfort increases with palpation.  Bowel sounds present.  Skin cool/dry/intact.

## 2014-03-19 NOTE — ED Provider Notes (Signed)
CSN: [id number]     Arrival date & time 03/19/14  0747 History   First MD Initiated Contact with Patient 03/19/14 [phone]     Chief Complaint  Patient presents with  . Generalized Body Aches  . Cough  . Emesis     HPI Pt was seen at 0755. Per pt, c/o gradual onset and persistence of multiple intermittent episodes of N/V that began yesterday. Has been associated with generalized abd "cramping," generalized body aches/fatigue. Denies diarrhea, no CP/SOB, no back pain, no fevers, no black or blood in stools or emesis. Pt's wife has similar symptoms.    History reviewed. No pertinent past medical history.    Past Surgical History  Procedure Laterality Date  . Knee arthroscopy      Right Knee per wife  . Knee surgery      Right Knee per wife x's 10 years ago   Family History  Problem Relation Age of Onset  . Prostate cancer Father [id number]  . Hypertension Mother   . Lung cancer Maternal Grandfather     Smoker  . Diabetes Mother    History  Substance Use Topics  . Smoking status: Current Every Day Smoker  . Smokeless tobacco: Not on file  . Alcohol Use: Yes     Comment: occasionally    Review of Systems ROS: Statement: All systems negative except as marked or noted in the HPI; Constitutional: Negative for fever and chills. ; ; Eyes: Negative for eye pain, redness and discharge. ; ; ENMT: Negative for ear pain, hoarseness, nasal congestion, sinus pressure and sore throat. ; ; Cardiovascular: Negative for chest pain, palpitations, diaphoresis, dyspnea and peripheral edema. ; ; Respiratory: Negative for cough, wheezing and stridor. ; ; Gastrointestinal: +N/V, abd pain. Negative for diarrhea, blood in stool, hematemesis, jaundice and rectal bleeding. . ; ; Genitourinary: Negative for dysuria, flank pain and hematuria. ; ; Musculoskeletal: Negative for back pain and neck pain. Negative for swelling and trauma.; ; Skin: Negative for pruritus, rash, abrasions, blisters, bruising and skin lesion.;  ; Neuro: Negative for headache, lightheadedness and neck stiffness. Negative for weakness, altered level of consciousness , altered mental status, extremity weakness, paresthesias, involuntary movement, seizure and syncope.      Allergies  Review of patient's allergies indicates no known allergies.  Home Medications   Prior to Admission medications   Medication Sig Start Date End Date Taking? Authorizing Provider  amLODipine (NORVASC) 5 MG tablet Take 1 tablet (5 mg total) by mouth daily. 05/01/11 04/30/12  Wanda PlumpJose E Paz, MD   BP 161/104 mmHg  Pulse 68  Temp(Src) 98.9 F (37.2 C) (Oral)  Resp 18  SpO2 100% Physical Exam  0800: Physical examination:  Nursing notes reviewed; Vital signs and O2 SAT reviewed;  Constitutional: Well developed, Well nourished, Uncomfortable appearing.; Head:  Normocephalic, atraumatic; Eyes: EOMI, PERRL, No scleral icterus; ENMT: Mouth and pharynx normal, Mucous membranes dry; Neck: Supple, Full range of motion, No lymphadenopathy; Cardiovascular: Regular rate and rhythm, No gallop; Respiratory: Breath sounds clear & equal bilaterally, No wheezes.  Speaking full sentences with ease, Normal respiratory effort/excursion; Chest: Nontender, Movement normal; Abdomen: Soft, +generalized tenderness to palp. No rebound or guarding. Nondistended, Normal bowel sounds; Genitourinary: No CVA tenderness; Extremities: Pulses normal, No tenderness, No edema, No calf edema or asymmetry.; Neuro: AA&Ox3, Major CN grossly intact.  Speech clear. No gross focal motor or sensory deficits in extremities.; Skin: Color normal, Warm, Dry.   ED Course  Procedures    0940:  Pt continues to c/o generalized abd pain and nausea despite multiple doses of IV medications for both. Will obtain CT A/P.        MDM  MDM Reviewed: previous chart, nursing note and vitals Reviewed previous: labs Interpretation: labs and x-ray     Results for orders placed or performed during the hospital  encounter of 03/19/14  Comprehensive metabolic panel  Result Value Ref Range   Sodium 141 135 - 145 mmol/L   Potassium 3.8 3.5 - 5.1 mmol/L   Chloride 108 96 - 112 mmol/L   CO2 24 19 - 32 mmol/L   Glucose, Bld 141 (H) 70 - 99 mg/dL   BUN 12 6 - 23 mg/dL   Creatinine, Ser 1.61 0.50 - 1.35 mg/dL   Calcium 9.1 8.4 - 09.6 mg/dL   Total Protein 7.6 6.0 - 8.3 g/dL   Albumin 4.2 3.5 - 5.2 g/dL   AST 25 0 - 37 U/L   ALT 18 0 - 53 U/L   Alkaline Phosphatase 68 39 - 117 U/L   Total Bilirubin 0.5 0.3 - 1.2 mg/dL   GFR calc non Af Amer 75 (L) >90 mL/min   GFR calc Af Amer 87 (L) >90 mL/min   Anion gap 9 5 - 15  Lipase, blood  Result Value Ref Range   Lipase 13 11 - 59 U/L  CBC with Differential  Result Value Ref Range   WBC 8.7 4.0 - 10.5 K/uL   RBC 4.37 4.22 - 5.81 MIL/uL   Hemoglobin 14.5 13.0 - 17.0 g/dL   HCT 04.5 40.9 - 81.1 %   MCV 96.1 78.0 - 100.0 fL   MCH 33.2 26.0 - 34.0 pg   MCHC 34.5 30.0 - 36.0 g/dL   RDW 91.4 78.2 - 95.6 %   Platelets 327 150 - 400 K/uL   Neutrophils Relative % 78 (H) 43 - 77 %   Lymphocytes Relative 13 12 - 46 %   Monocytes Relative 9 3 - 12 %   Eosinophils Relative 0 0 - 5 %   Basophils Relative 0 0 - 1 %   Neutro Abs 6.8 1.7 - 7.7 K/uL   Lymphs Abs 1.1 0.7 - 4.0 K/uL   Monocytes Absolute 0.8 0.1 - 1.0 K/uL   Eosinophils Absolute 0.0 0.0 - 0.7 K/uL   Basophils Absolute 0.0 0.0 - 0.1 K/uL   Smear Review MORPHOLOGY UNREMARKABLE    Ct Abdomen Pelvis W Contrast 03/19/2014   CLINICAL DATA:  Nausea, abdominal pain, vomiting  EXAM: CT ABDOMEN AND PELVIS WITH CONTRAST  TECHNIQUE: Multidetector CT imaging of the abdomen and pelvis was performed using the standard protocol following bolus administration of intravenous contrast.  CONTRAST:  OMNIPAQUE IOHEXOL 300 MG/ML  SOLN  COMPARISON:  01/03/2011  FINDINGS: Lung bases are unremarkable. Sagittal images of the spine shows mild degenerative changes lumbar spine. There is mild hepatic fatty infiltration. No  focal hepatic mass. The pancreas and adrenal glands are unremarkable. Again noted small size spleen stable in appearance from prior exam.  Kidneys are symmetrical in size and enhancement. No hydronephrosis or hydroureter. Delayed renal images shows bilateral renal symmetrical excretion. Bilateral visualized proximal ureter is unremarkable. Atherosclerotic calcifications of abdominal aorta and iliac arteries. No aortic aneurysm. No small bowel obstruction. No ascites or free air. No adenopathy. Normal retrocecal appendix noted in axial image 42.  The terminal ileum is unremarkable.  Moderate stool noted in distal sigmoid colon and rectum. The urinary bladder is unremarkable. Seminal vesicles  are unremarkable. Again noted a prostate gland calcification measures 4.4 mm.  IMPRESSION: 1. No acute inflammatory process within abdomen or pelvis. 2. Mild hepatic fatty infiltration. 3. No hydronephrosis or hydroureter. Bilateral renal symmetrical excretion. 4. Atherosclerotic calcifications of abdominal aorta and iliac arteries. 5. No pericecal inflammation.  Normal appendix.   Electronically Signed   By: Natasha Mead M.D.   On: 03/19/2014 11:31   Dg Abd Acute W/chest 03/19/2014   CLINICAL DATA:  Cough, body aches, vomiting for 2 days  EXAM: ACUTE ABDOMEN SERIES (ABDOMEN 2 VIEW & CHEST 1 VIEW)  COMPARISON:  12/30/2010  FINDINGS: There is no evidence of dilated bowel loops or free intraperitoneal air. No radiopaque calculi or other significant radiographic abnormality is seen. Heart size and mediastinal contours are within normal limits. Both lungs are clear. Some colonic stool noted in right colon and rectum.  IMPRESSION: Negative abdominal radiographs.  No acute cardiopulmonary disease.   Electronically Signed   By: Natasha Mead M.D.   On: 03/19/2014 08:59    1510:  Pt has tol PO well while in the ED without N/V.  No stooling while in the ED.  Abd now benign, VSS. Feels better after meds. Pt has gotten himself dressed and  is sitting on the side of the stretcher, requesting to be discharged. Dx and testing d/w pt and family.  Questions answered.  Verb understanding, agreeable to d/c home with outpt f/u.    Samuel Jester, DO 03/22/14 1824

## 2014-03-19 NOTE — ED Notes (Signed)
Pt receiving IV infusion will attempt to collect blood at later time

## 2014-03-19 NOTE — Discharge Instructions (Signed)
°Emergency Department Resource Guide °1) Find a Doctor and Pay Out of Pocket °Although you won't have to find out who is covered by your insurance plan, it is a good idea to ask around and get recommendations. You will then need to call the office and see if the doctor you have chosen will accept you as a new patient and what types of options they offer for patients who are self-pay. Some doctors offer discounts or will set up payment plans for their patients who do not have insurance, but you will need to ask so you aren't surprised when you get to your appointment. ° °2) Contact Your Local Health Department °Not all health departments have doctors that can see patients for sick visits, but many do, so it is worth a call to see if yours does. If you don't know where your local health department is, you can check in your phone book. The CDC also has a tool to help you locate your state's health department, and many state websites also have listings of all of their local health departments. ° °3) Find a Walk-in Clinic °If your illness is not likely to be very severe or complicated, you may want to try a walk in clinic. These are popping up all over the country in pharmacies, drugstores, and shopping centers. They're usually staffed by nurse practitioners or physician assistants that have been trained to treat common illnesses and complaints. They're usually fairly quick and inexpensive. However, if you have serious medical issues or chronic medical problems, these are probably not your best option. ° °No Primary Care Doctor: °- Call [facility] at  832-8000 - they can help you locate a primary care doctor that  accepts your insurance, provides certain services, etc. °- Physician Referral Service- 1-800-533-3463 ° °Chronic Pain Problems: °Organization         Address  Phone   Notes  °[facility]  (336) 297-2271 Patients need to be referred by their primary care doctor.  ° °Medication  Assistance: °Organization         Address  Phone   Notes  °[place] [facility] [address] °[place], [place] [address] (336) 641-8030 --Must be a resident of [place] °-- Must have NO insurance coverage whatsoever (no Medicaid/ Medicare, etc.) °-- The pt. MUST have a primary care doctor that directs their care regularly and follows them in the community °  °[facility]  (866) 331-1348   °[employer]  (888) 892-1162   ° °Agencies that provide inexpensive medical care: °Organization         Address  Phone   Notes  °[facility]  (336) 832-8035   °[facility]    (336) 832-7272   °[facility] [address] °[place], [place] [address] (336) 832-4777   °[facility] [address], °[place] (336) 271-4999   °[facility]    (336) 373-0678   °[facility]    (336) 272-1050   °[facility] ° [address], [place] Phone:  (336) 832-4444, Fax:  (336) 832-4440 Hours of Operation:  9 am - 6 pm, M-F.  Also accepts Medicaid/Medicare and self-pay.  °[facility] ° [address], [place] Phone: (336) 832-3150, Fax: (336) 832-3151. Hours of Operation:  8:30 am - 5:30 pm, M-F.  Also accepts Medicaid and self-pay.  °[facility] [place] [address], [place] Phone: (336) 878-6027   °[facility] [address], [place], [place] (336)723-1848, Ext. 123 Mondays & Thursdays: 7-9 AM.  First 15 patients are seen on a first come, first serve basis. °  ° °Medicaid-accepting [place] Providers: ° °Organization         Address  Phone   Notes  °[facility] [address], [place] (336) 641-2100 Also accepts self-pay patients.  °[facility] [address], [place] ° (336) 856-9996   °[facility] [address], [place]  (336) 288-8857   °[facility] [address] [place] [address], [place] (336) 299-7000   °Veita Bland [address], [place]  ° (336) 373-1557 Only accepts [place] Access Medicaid patients after they have their name applied to their card.  ° °Self-Pay (no insurance) in [place]: ° °Organization         Address  Phone   Notes  °Sickle Cell Patients, [facility] [address], [place] (336) 832-1970   °[facility] [address], [place] (336) 832-4400   °[facility] ° [address], [place] (336) 992-4800   °[facility]/Dr. Osei-Bonsu ° [address] [place] [address], [place] or [address], [place] (336) 841-8500 Phone number for both [place] and [place] locations is the same.  °[facility] [address], [place] (336) 299-0000   °[facility] [address] [place] [address], [place] or [address] (336) 852-7530 °(336) 878-2260   °[facility] [address], [place] (336) 350-1642, phone; (336) 294-5005, fax Sees patients 1st and 3rd Saturday of every month.  Must not qualify for public or private insurance (i.e. Medicaid, Medicare, [place] Health Choice, Veterans' Benefits) • Household income should be no more than 200% of the poverty level •The clinic cannot treat you if you are pregnant or think you are pregnant • Sexually transmitted diseases are not treated at the clinic.  ° ° °Dental Care: °Organization         Address  Phone  Notes  °[place] [facility] [address], [place] (336) 641-6152 Accepts children up to age 21 who are enrolled in Medicaid or [place] Health Choice; pregnant women with a Medicaid card; and children who have applied for Medicaid or [place] Health Choice, but were declined, whose parents can pay a reduced fee at time of service.  °[place]  [facility] [place]  [address], [place] (336) 641-7733 Accepts children up to age 21 who are enrolled in Medicaid or [place] Health Choice; pregnant women with a Medicaid card; and children who have applied for Medicaid or [place] Health Choice, but were declined, whose parents can pay a reduced fee at time of service.  °[facility] ° [address], [place] (336) 641-4533 Patients are seen by appointment only. Walk-ins are not accepted. [facility] will see patients 18 years of age and older. °Monday - Tuesday (8am-5pm) °Most Wednesdays (8:30-5pm) °$30 per visit, cash only  °[facility] ° [address], [place] (336) 641-4533 Patients are seen by appointment only. Walk-ins are not accepted. [facility] will see patients 18 years of age and older. °One   Wednesday Evening (Monthly: Volunteer Based).  $30 per visit, cash only  °[facility]  (919) 537-3737 for adults; Children under age 4, call Graduate Pediatric Dentistry at (919) [phone]. Children aged 4-14, please call (919) 537-3737 to request a pediatric application. ° Dental services are provided in all areas of dental care including fillings, crowns and bridges, complete and partial dentures, implants, gum treatment, root canals, and extractions. Preventive care is also provided. Treatment is provided to both adults and children. °Patients are selected via a lottery and there is often a waiting list. °  °[facility] [address], °[place] ° (336) 763-8833 [email or web address] °  °[facility] [address], [place], [place] (336)723-1848, Ext. 123 Second and Fourth Thursday of each month, opens at 6:30 AM; Clinic ends at 9 AM.  Patients are seen on a first-come first-served basis, and a limited number are seen during each clinic.  ° °[facility] ° [address], [place], [place] (336) 723-7904    Eligibility Requirements °You must have lived in [place], [place], or [place] counties for at least the last three months. °  You cannot be eligible for state or federal sponsored healthcare insurance, including Veterans Administration, Medicaid, or Medicare. °  You generally cannot be eligible for healthcare insurance through your employer.  °  How to apply: °Eligibility screenings are held every Tuesday and Wednesday afternoon from 1:00 pm until 4:00 pm. You do not need an appointment for the interview!  °[facility] [address], [place], [place] 336-631-2330   °[facility] [facility]  336-342-8273   °[facility]  336-703-3100   °[facility]  336-570-6415   ° °Behavioral Health Resources in the Community: °Intensive Outpatient Programs °Organization         Address  Phone  Notes  °[facility] [address], [place], [place] 336-878-6098   °[facility] [address], [place], [place] 336-832-9800   °[facility] [address], [place], [place] ° 336-882-2125   °[facility] [address],  °[place], [place] 1-800-853-5163 or 336-641-4981   °Substance Abuse Resources °Organization         Address  Phone  Notes  °[facility]  336-882-2125   °[facility]  336-784-9470   °[facility]  336-285-9073   °[facility]  336-845-3988   °Residential & Outpatient Substance Abuse Program  1-800-659-3381   °Psychological Services °Organization         Address  Phone  Notes  °[facility]  336- 832-9600   °[facility]  336- 378-7881   °[facility] [address], [place] 1-800-853-5163 or 336-641-4981   ° °Mobile Crisis Teams °Organization         Address  Phone  Notes  °[facility], Mobile Crisis Care Unit  1-877-626-1772   °Assertive °[facility] ° [address]  [place], [place] 336-834-9664   °Sharon DeEsch [address] °[place] 336-554-5454   ° °Self-Help/Support Groups °Organization         Address  Phone             Notes  °[facility]  - variety of support groups  336- 373-1402 Call for more information  °Narcotics Anonymous (NA), [facility] [address], °[place]  2 meetings at this location  ° °  Residential Treatment Programs °Organization         Address  Phone  Notes  °[facility] [address],    °[place]  1-866-801-8205   °[facility] ° [address], [place], [place] 704-293-8524   °[facility] [address], [place] 336-845-3988 Admissions: 8am-3pm M-F  °[facility] [address]    °[place], [place] 336-841-1104   °[facility] [address], [place], [place] 336-379-7146   °[facility] [address]  °[place], [place] 336-285-9073   °[facility] - Intensive Outpatient [address], [place], [place] 336-852-3033   °[facility] [address]  °[place], [place] 1-877-615-2722 or 336-784-9470   °[facility] [address] [place], [place] 336-227-7417 Accepts Medicaid  °[facility] [address]  °[place] 1-800-659-3381 Substance Abuse/Addiction Treatment  ° °[place] Behavioral Health Resources °Organization         Address  Phone  Notes  °[facility]  (888) 581-9988   °Julie Brannon, PhD [address] [place], [place]   (336) 349-5553 or (336) 951-0000   °[facility]   [address] °[place], [place] (336) 349-4454   °[facility] [address], [place], [place] (336) 342-8316 Insurance/Medicaid/sponsorship through [facility]  °[facility] [address]                                    [place], [place] (336) 342-8316 Therapy/tele-psych/case    °[facility] [address]  ° [place],  (336) 349-2233    °Dr. Arfeen  (336) 349-4544   °[facility] [place]  [employer] [place] [facility] 1) [address], [place] °2) [address], [place] °3)  [address], [place] (336) 349-3220 °(336) 342-7768 ° °(336) 342-8140   °[place] [facility] (336) 342-1394 or (336) 342-3537 (After Hours)    ° ° °Take the prescriptions as directed.  Increase your fluid intake (ie:  Gatoraide) for the next few days, as discussed.  Eat a bland diet and advance to your regular diet slowly as you can tolerate it.  Call your regular medical doctor today to schedule a follow up appointment this week.  Return to the Emergency Department immediately if not improving (or even worsening) despite taking the medicines as prescribed, any black or bloody stool or vomit, if you develop a fever over "101," or for any other concerns. ° °

## 2014-03-19 NOTE — ED Notes (Signed)
Patient given ginger ale. 

## 2014-12-12 ENCOUNTER — Emergency Department (HOSPITAL_COMMUNITY): Payer: Commercial Managed Care - HMO

## 2014-12-12 ENCOUNTER — Encounter (HOSPITAL_COMMUNITY): Payer: Self-pay | Admitting: Emergency Medicine

## 2014-12-12 ENCOUNTER — Observation Stay (HOSPITAL_COMMUNITY)
Admission: EM | Admit: 2014-12-12 | Discharge: 2014-12-14 | Disposition: A | Discharge status: Home / Self Care | Payer: Commercial Managed Care - HMO | Attending: Internal Medicine | Admitting: Internal Medicine

## 2014-12-12 DIAGNOSIS — N12 Tubulo-interstitial nephritis, not specified as acute or chronic: Secondary | ICD-10-CM | POA: Diagnosis not present

## 2014-12-12 DIAGNOSIS — Z79899 Other long term (current) drug therapy: Secondary | ICD-10-CM | POA: Diagnosis not present

## 2014-12-12 DIAGNOSIS — R1030 Lower abdominal pain, unspecified: Secondary | ICD-10-CM

## 2014-12-12 DIAGNOSIS — R109 Unspecified abdominal pain: Secondary | ICD-10-CM | POA: Diagnosis present

## 2014-12-12 DIAGNOSIS — D72829 Elevated white blood cell count, unspecified: Secondary | ICD-10-CM | POA: Diagnosis present

## 2014-12-12 DIAGNOSIS — I10 Essential (primary) hypertension: Secondary | ICD-10-CM | POA: Diagnosis not present

## 2014-12-12 DIAGNOSIS — A419 Sepsis, unspecified organism: Secondary | ICD-10-CM | POA: Diagnosis not present

## 2014-12-12 DIAGNOSIS — Z23 Encounter for immunization: Secondary | ICD-10-CM | POA: Insufficient documentation

## 2014-12-12 LAB — URINE MICROSCOPIC-ADD ON: Squamous Epithelial / LPF: NONE SEEN

## 2014-12-12 LAB — LACTIC ACID, PLASMA
LACTIC ACID, VENOUS: 1 mmol/L (ref 0.5–2.0)
Lactic Acid, Venous: 0.9 mmol/L (ref 0.5–2.0)

## 2014-12-12 LAB — CBC
HCT: 44 % (ref 39.0–52.0)
Hemoglobin: 15.3 g/dL (ref 13.0–17.0)
MCH: 32.7 pg (ref 26.0–34.0)
MCHC: 34.8 g/dL (ref 30.0–36.0)
MCV: 94 fL (ref 78.0–100.0)
PLATELETS: 339 10*3/uL (ref 150–400)
RBC: 4.68 MIL/uL (ref 4.22–5.81)
RDW: 13.6 % (ref 11.5–15.5)
WBC: 20.3 10*3/uL — ABNORMAL HIGH (ref 4.0–10.5)

## 2014-12-12 LAB — COMPREHENSIVE METABOLIC PANEL
ALT: 18 U/L (ref 17–63)
ANION GAP: 9 (ref 5–15)
AST: 30 U/L (ref 15–41)
Albumin: 4.4 g/dL (ref 3.5–5.0)
Alkaline Phosphatase: 68 U/L (ref 38–126)
BUN: 26 mg/dL — AB (ref 6–20)
CO2: 27 mmol/L (ref 22–32)
CREATININE: 1.18 mg/dL (ref 0.61–1.24)
Calcium: 9.7 mg/dL (ref 8.9–10.3)
Chloride: 98 mmol/L — ABNORMAL LOW (ref 101–111)
GFR calc Af Amer: >60 mL/min (ref >60)
GFR calc non Af Amer: >60 mL/min (ref >60)
Glucose, Bld: 112 mg/dL — ABNORMAL HIGH (ref 65–99)
POTASSIUM: 4.3 mmol/L (ref 3.5–5.1)
SODIUM: 134 mmol/L — AB (ref 135–145)
Total Bilirubin: 1.4 mg/dL — ABNORMAL HIGH (ref 0.3–1.2)
Total Protein: 8.1 g/dL (ref 6.5–8.1)

## 2014-12-12 LAB — URINALYSIS, ROUTINE W REFLEX MICROSCOPIC
Glucose, UA: NEGATIVE mg/dL
Ketones, ur: 40 mg/dL — AB
Leukocytes, UA: NEGATIVE
Nitrite: NEGATIVE
PROTEIN: NEGATIVE mg/dL
Specific Gravity, Urine: 1.029 (ref 1.005–1.030)
pH: 6 (ref 5.0–8.0)

## 2014-12-12 LAB — TSH: TSH: 0.97 u[IU]/mL (ref 0.350–4.500)

## 2014-12-12 LAB — LIPASE, BLOOD: Lipase: 23 U/L (ref 11–51)

## 2014-12-12 LAB — PROCALCITONIN

## 2014-12-12 MED ORDER — INFLUENZA VAC SPLIT QUAD 0.5 ML IM SUSY
0.5000 mL | PREFILLED_SYRINGE | INTRAMUSCULAR | Status: DC
  Filled 2014-12-12 (×2): qty 0.5

## 2014-12-12 MED ORDER — ONDANSETRON HCL 4 MG PO TABS: 4.0000 mg | ORAL_TABLET | Freq: Four times a day (QID) | ORAL | Status: DC | PRN

## 2014-12-12 MED ORDER — HYDROMORPHONE HCL 1 MG/ML IJ SOLN
1.0000 mg | INTRAMUSCULAR | Status: DC | PRN
Start: 2014-12-12 — End: 2014-12-12
  Administered 2014-12-12: 1 mg via INTRAVENOUS
  Filled 2014-12-12: qty 1

## 2014-12-12 MED ORDER — ACETAMINOPHEN 325 MG PO TABS: 650.0000 mg | ORAL_TABLET | Freq: Four times a day (QID) | ORAL | Status: DC | PRN

## 2014-12-12 MED ORDER — DEXTROSE 5 % IV SOLN
2.0000 g | INTRAVENOUS | Status: DC
  Administered 2014-12-13 – 2014-12-14 (×2): 2 g via INTRAVENOUS
  Filled 2014-12-12 (×2): qty 2

## 2014-12-12 MED ORDER — IOHEXOL 300 MG/ML  SOLN
100.0000 mL | Freq: Once | INTRAMUSCULAR | Status: AC | PRN
  Administered 2014-12-12: 100 mL via INTRAVENOUS

## 2014-12-12 MED ORDER — ACETAMINOPHEN 650 MG RE SUPP: 650.0000 mg | Freq: Four times a day (QID) | RECTAL | Status: DC | PRN

## 2014-12-12 MED ORDER — ONDANSETRON HCL 4 MG/2ML IJ SOLN: 4.0000 mg | Freq: Four times a day (QID) | INTRAMUSCULAR | Status: DC | PRN

## 2014-12-12 MED ORDER — PNEUMOCOCCAL VAC POLYVALENT 25 MCG/0.5ML IJ INJ
0.5000 mL | INJECTION | INTRAMUSCULAR | Status: DC
  Filled 2014-12-12 (×2): qty 0.5

## 2014-12-12 MED ORDER — DEXTROSE 5 % IV SOLN
2.0000 g | Freq: Once | INTRAVENOUS | Status: AC
  Administered 2014-12-12: 2 g via INTRAVENOUS
  Filled 2014-12-12: qty 2

## 2014-12-12 MED ORDER — AMLODIPINE BESYLATE 5 MG PO TABS
5.0000 mg | ORAL_TABLET | Freq: Every day | ORAL | Status: DC
  Administered 2014-12-12 – 2014-12-14 (×3): 5 mg via ORAL
  Filled 2014-12-12 (×3): qty 1

## 2014-12-12 MED ORDER — ONDANSETRON HCL 4 MG/2ML IJ SOLN: 4.0000 mg | Freq: Three times a day (TID) | INTRAMUSCULAR | Status: DC | PRN

## 2014-12-12 MED ORDER — HYDROMORPHONE HCL 1 MG/ML IJ SOLN
1.0000 mg | INTRAMUSCULAR | Status: DC | PRN
  Administered 2014-12-12 – 2014-12-14 (×9): 1 mg via INTRAVENOUS
  Filled 2014-12-12 (×10): qty 1

## 2014-12-12 MED ORDER — SODIUM CHLORIDE 0.9 % IV BOLUS (SEPSIS)
1000.0000 mL | Freq: Once | INTRAVENOUS | Status: AC
  Administered 2014-12-12: 1000 mL via INTRAVENOUS

## 2014-12-12 MED ORDER — FENTANYL CITRATE (PF) 100 MCG/2ML IJ SOLN
12.5000 ug | Freq: Once | INTRAMUSCULAR | Status: AC
  Administered 2014-12-12: 12.5 ug via INTRAVENOUS
  Filled 2014-12-12: qty 2

## 2014-12-12 MED ORDER — CIPROFLOXACIN IN D5W 400 MG/200ML IV SOLN
400.0000 mg | Freq: Once | INTRAVENOUS | Status: AC
  Administered 2014-12-12: 400 mg via INTRAVENOUS
  Filled 2014-12-12: qty 200

## 2014-12-12 MED ORDER — SODIUM CHLORIDE 0.9 % IV SOLN: INTRAVENOUS | Status: DC

## 2014-12-12 MED ORDER — ONDANSETRON HCL 4 MG/2ML IJ SOLN
4.0000 mg | Freq: Once | INTRAMUSCULAR | Status: AC
  Administered 2014-12-12: 4 mg via INTRAVENOUS
  Filled 2014-12-12: qty 2

## 2014-12-12 MED ORDER — SODIUM CHLORIDE 0.9 % IV SOLN
INTRAVENOUS | Status: DC
  Administered 2014-12-12 – 2014-12-14 (×3): via INTRAVENOUS

## 2014-12-12 NOTE — H&P (Signed)
[facility] History and Physical  Mario Woodard GGE:[id number] DOB: 07-10-1955 DOA: 12/12/2014  Referring physician: ER PA: Delrae Rend PCP: Kathlene November, MD  Chief Complaint: abdominal pain  HPI:  59 y.o. male with no significant past medical history who presented to [facility] ED with reports of ongoing abdominal pain, nausea and non bloody vomiting for past few days prior to this admission. Pt also had few episodes of diarrhea but has no diarrhea at this time. In regards to abdominal pain he reported mostly sharp, intermittent pain in lower abdomen occasionally but not consistently radiating to the back. He has not taken anything for pain at home but analgesia in ED did provide significant symptomatic relief. Pain was present at rest and with movement. No fevers or chills at home. No reports of chest pain, shortness of breath or palpitations. No reports of lightheadedness or loss of consciousness.   In ED, pt was hemodynamically stable. Blood work demonstrated WBC count of 20.3. UA showed rare bacteria. CT abdomen concerning for pyelonephritis or renal infarction. Started rocephin (cipro given in ED) however per sepsis work up rocephin is the abx of choice.  Assessment & Plan    Principal Problem:   Sepsis ([place]) / Pyelonephritis / Leukocytosis / Abdominal pain - Sepsis criteria met on admission with tachycardia, leukocytosis and source of infection pyelonephritis as evident on CT abdomen - Sepsis work up initiated - Follow up lactic acid, procalcitonin, blood and urine cultures results - Continue rocephin - Hemodynamically stable and does not require pressor support - Continue supportive care with IV fluis for hydration - Continue analgesia and antiemetics as needed  Active Problems:   Benign essential HTN - Continue Norvasc   DVT prophylaxis: - SCD's bilaterally   Radiological Exams on Admission: Ct Abdomen Pelvis W Contrast 12/12/2014 1. Interval wedge-shaped low density in the mid and  inferior left kidney without mass effect. This can be seen with renal infarction and pyelonephritis. 2. 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 3. Stable small amount of fat in the anterior wall of the urinary bladder. This could be due to previous infection. Electronically Signed   By: Claudie Revering M.D.   On: 12/12/2014 14:38    Code Status: Full Family Communication: Plan of care discussed with the patient  Disposition Plan: Admit for further evaluation  Leisa Lenz, MD  [facility] Pager [phone]  Time spent in minutes: 75 minutes  Review of Systems:  Constitutional: Negative for fever, chills and malaise/fatigue. Negative for diaphoresis.  HENT: Negative for hearing loss, ear pain, nosebleeds, congestion, sore throat, neck pain, tinnitus and ear discharge.   Eyes: Negative for blurred vision, double vision, photophobia, pain, discharge and redness.  Respiratory: Negative for cough, hemoptysis, sputum production, shortness of breath, wheezing and stridor.   Cardiovascular: Negative for chest pain, palpitations, orthopnea, claudication and leg swelling.  Gastrointestinal: Negative for nausea, vomiting and abdominal pain. Negative for heartburn, constipation, blood in stool and melena.  Genitourinary: Negative for dysuria, urgency, frequency, hematuria and flank pain.  Musculoskeletal: Negative for myalgias, back pain, joint pain and falls.  Skin: Negative for itching and rash.  Neurological: Negative for dizziness and weakness. Negative for tingling, tremors, sensory change, speech change, focal weakness, loss of consciousness and headaches.  Endo/Heme/Allergies: Negative for environmental allergies and polydipsia. Does not bruise/bleed easily.  Psychiatric/Behavioral: Negative for suicidal ideas. The patient is not nervous/anxious.      History reviewed. No pertinent past medical history. Past Surgical History  Procedure Laterality Date  .  Knee arthroscopy       Right Knee per wife  . Knee surgery      Right Knee per wife x's 10 years ago   Social History:  reports that he has been smoking.  He does not have any smokeless tobacco history on file. He reports that he drinks alcohol. He reports that he does not use illicit drugs.  No Known Allergies  Family History:  Family History  Problem Relation Age of Onset  . Prostate cancer Father 38  . Hypertension Mother   . Lung cancer Maternal Grandfather     Smoker  . Diabetes Mother      Prior to Admission medications   Medication Sig Start Date End Date Taking? Authorizing Provider  amLODipine (NORVASC) 5 MG tablet Take 1 tablet (5 mg total) by mouth daily. 05/01/11 04/30/12  Colon Branch, MD  ondansetron (ZOFRAN) 4 MG tablet Take 1 tablet (4 mg total) by mouth every 8 (eight) hours as needed for nausea or vomiting. Patient not taking: Reported on 12/12/2014 03/19/14   Francine Graven, DO  oxyCODONE-acetaminophen (PERCOCET/ROXICET) 5-325 MG per tablet 1 or 2 tabs PO q6h prn pain Patient not taking: Reported on 12/12/2014 03/19/14   Francine Graven, DO   Physical Exam: Filed Vitals:   12/12/14 1400 12/12/14 1430 12/12/14 1500 12/12/14 1516  BP: 199/118 191/107 186/112 184/99  Pulse: 57 69 66 62  Temp:      TempSrc:      Resp:    18  SpO2: 100% 97% 97% 98%    Physical Exam  Constitutional: Appears well-developed and well-nourished. No distress.  HENT: Normocephalic. No tonsillar erythema or exudates Eyes: Conjunctivae are normal. No scleral icterus.  Neck: Normal ROM. Neck supple. No JVD. No tracheal deviation. No thyromegaly.  CVS: RRR, S1/S2 +, no murmurs, no gallops, no carotid bruit.  Pulmonary: Effort and breath sounds normal, no stridor, rhonchi, wheezes, rales.  Abdominal: Soft. BS +,  no distension, tenderness, rebound or guarding.  Musculoskeletal: Normal range of motion. No edema and no tenderness.  Lymphadenopathy: No lymphadenopathy noted, cervical, inguinal. Neuro: Alert.  Normal reflexes, muscle tone coordination. No focal neurologic deficits. Skin: Skin is warm and dry. No rash noted.  No erythema. No pallor.  Psychiatric: Normal mood and affect. Behavior, judgment, thought content normal.   Labs on Admission:  Basic Metabolic Panel:  Recent Labs Lab 12/12/14 1110  NA 134*  K 4.3  CL 98*  CO2 27  GLUCOSE 112*  BUN 26*  CREATININE 1.18  CALCIUM 9.7   Liver Function Tests:  Recent Labs Lab 12/12/14 1110  AST 30  ALT 18  ALKPHOS 68  BILITOT 1.4*  PROT 8.1  ALBUMIN 4.4    Recent Labs Lab 12/12/14 1110  LIPASE 23   No results for input(s): AMMONIA in the last 168 hours. CBC:  Recent Labs Lab 12/12/14 1225  WBC 20.3*  HGB 15.3  HCT 44.0  MCV 94.0  PLT 339   Cardiac Enzymes: No results for input(s): CKTOTAL, CKMB, CKMBINDEX, TROPONINI in the last 168 hours. BNP: Invalid input(s): POCBNP CBG: No results for input(s): GLUCAP in the last 168 hours.  If 7PM-7AM, please contact night-coverage [email or web address] Password [email or web address] 12/12/2014, 3:35 PM

## 2014-12-12 NOTE — ED Notes (Signed)
Per pt, states abdominal pain, nausea, vomiting and diarrhea for 2 days

## 2014-12-12 NOTE — Progress Notes (Signed)
ANTIBIOTIC CONSULT NOTE - INITIAL  Pharmacy Consult for Ceftriaxone Indication: UTI  No Known Allergies  Patient Measurements:   Adjusted Body Weight:   Vital Signs: Temp: 98.5 F (36.9 C) (12/03 1313) Temp Source: Core (Comment) (12/03 1313) BP: 184/99 mmHg (12/03 1516) Pulse Rate: 62 (12/03 1516) Intake/Output from previous day:   Intake/Output from this shift:    Labs:  Recent Labs  12/12/14 1110 12/12/14 1225  WBC  --  20.3*  HGB  --  15.3  PLT  --  339  CREATININE 1.18  --    CrCl cannot be calculated (Unknown ideal weight.). No results for input(s): VANCOTROUGH, VANCOPEAK, VANCORANDOM, GENTTROUGH, GENTPEAK, GENTRANDOM, TOBRATROUGH, TOBRAPEAK, TOBRARND, AMIKACINPEAK, AMIKACINTROU, AMIKACIN in the last 72 hours.   Microbiology: No results found for this or any previous visit (from the past 720 hour(s)).  Medical History: History reviewed. No pertinent past medical history.  Assessment: [age] YOM presents with abdominal pain, N/V/D.  CT abd pelvis with question of pyelonephritis (vs renal infarction). Pharmacy asked to dose ceftriaxone.   WBC elevated  SCR WNL  Goal of Therapy:  Dose for indication and for patient-specific parameters  Plan:   Ceftriaxone 2gm IV q24h  Does not require adjustment for renal function  Will sign off at this time.  Please reconsult if a change in clinical status warrants re-evaluation of dosage.  Juliette Alcideustin Zeigler, PharmD, BCPS.   Pager: [phone] 12/12/2014 3:55 PM

## 2014-12-12 NOTE — ED Provider Notes (Signed)
CSN: [id number]     Arrival date & time 12/12/14  1011 History   First MD Initiated Contact with Patient 12/12/14 1056     Chief Complaint  Patient presents with  . Abdominal Pain    HPI   Mario Woodard is an 59 y.o. male with no significant PMH who presents to the ED for evaluation of abdominal pain, N/V/D. He states his symptoms started two days ago. He describes the pain as diffuse pain and cramping around his entire abdomen, particularly his lower abdomen. States that with the pain he has been nauseated with multiple episodes of NBNB emesis a day. He also endorses 3-4 episodes of watery diarrhea a day. States no one at home has similar symptoms. States he has not tried anything to alleviate his symptoms. Denies recent travel. Denies chest pain, SOB. Denies dysuria, urinary frequency, or urgency. STates he still has his appendix and gallbladder.  History reviewed. No pertinent past medical history. Past Surgical History  Procedure Laterality Date  . Knee arthroscopy      Right Knee per wife  . Knee surgery      Right Knee per wife x's 10 years ago   Family History  Problem Relation Age of Onset  . Prostate cancer Father 37  . Hypertension Mother   . Lung cancer Maternal Grandfather     Smoker  . Diabetes Mother    Social History  Substance Use Topics  . Smoking status: Current Every Day Smoker  . Smokeless tobacco: None  . Alcohol Use: Yes     Comment: occasionally    Review of Systems  All other systems reviewed and are negative.     Allergies  Review of patient's allergies indicates no known allergies.  Home Medications   Prior to Admission medications   Medication Sig Start Date End Date Taking? Authorizing Provider  amLODipine (NORVASC) 5 MG tablet Take 1 tablet (5 mg total) by mouth daily. 05/01/11 04/30/12  Wanda Plump, MD  ondansetron (ZOFRAN) 4 MG tablet Take 1 tablet (4 mg total) by mouth every 8 (eight) hours as needed for nausea or vomiting. Patient not  taking: Reported on 12/12/2014 03/19/14   Samuel Jester, DO  oxyCODONE-acetaminophen (PERCOCET/ROXICET) 5-325 MG per tablet 1 or 2 tabs PO q6h prn pain Patient not taking: Reported on 12/12/2014 03/19/14   Samuel Jester, DO   BP 139/108 mmHg  Pulse 93  Temp(Src) 98.3 F (36.8 C) (Oral)  Resp 16  SpO2 99% Physical Exam  Constitutional: He is oriented to person, place, and time.  Appears uncomfortable but NAD  HENT:  Right Ear: External ear normal.  Left Ear: External ear normal.  Nose: Nose normal.  Mouth/Throat: Oropharynx is clear and moist. No oropharyngeal exudate.  Eyes: Conjunctivae and EOM are normal. Pupils are equal, round, and reactive to light.  Neck: Normal range of motion. Neck supple.  Cardiovascular: Normal rate, regular rhythm, normal heart sounds and intact distal pulses.   Pulmonary/Chest: Effort normal and breath sounds normal. No respiratory distress. He has no wheezes.  Abdominal: Soft. Bowel sounds are normal. He exhibits no distension.  Abd is diffusely ttp. Not rigid. No guarding. No rebound. No CVA tenderness.  Musculoskeletal: Normal range of motion. He exhibits no edema or tenderness.  Neurological: He is alert and oriented to person, place, and time. He has normal strength. No cranial nerve deficit or sensory deficit.  Skin: Skin is warm and dry. There is pallor.  Psychiatric: He has a normal mood  and affect.  Nursing note and vitals reviewed.   ED Course  Procedures (including critical care time) Labs Review Labs Reviewed  COMPREHENSIVE METABOLIC PANEL - Abnormal; Notable for the following:    Sodium 134 (*)    Chloride 98 (*)    Glucose, Bld 112 (*)    BUN 26 (*)    Total Bilirubin 1.4 (*)    All other components within normal limits  URINALYSIS, ROUTINE W REFLEX MICROSCOPIC (NOT AT [facility]) - Abnormal; Notable for the following:    Color, Urine AMBER (*)    Hgb urine dipstick MODERATE (*)    Bilirubin Urine SMALL (*)    Ketones, ur 40 (*)     All other components within normal limits  CBC - Abnormal; Notable for the following:    WBC 20.3 (*)    All other components within normal limits  URINE MICROSCOPIC-ADD ON - Abnormal; Notable for the following:    Bacteria, UA RARE (*)    Casts HYALINE CASTS (*)    All other components within normal limits  LIPASE, BLOOD    Imaging Review Ct Abdomen Pelvis W Contrast  12/12/2014  CLINICAL DATA:  Lower pelvic pain for the past 3 days. Nausea and vomiting. Leukocytosis. EXAM: CT ABDOMEN AND PELVIS WITH CONTRAST TECHNIQUE: Multidetector CT imaging of the abdomen and pelvis was performed using the standard protocol following bolus administration of intravenous contrast. CONTRAST:  OMNIPAQUE IOHEXOL 300 MG/ML  SOLN COMPARISON:  03/19/2014. FINDINGS: Lower chest:  Clear lung bases. Hepatobiliary: No masses or other significant abnormality. Pancreas: No mass, inflammatory changes, or other significant abnormality. Spleen: Small and lobulated, unchanged. Adrenals/Urinary Tract: Interval wedge-shaped low density in the mid and inferior portions of the left kidney with no mass effect. A small lower pole right renal cyst is unchanged. The left adrenal gland remains prominent with no discrete mass. The right adrenal gland continues to have a normal appearance. Unremarkable ureters. A small amount of fat in the anterior wall of the urinary bladder is unchanged. Stomach/Bowel: No gastrointestinal abnormalities. Normal appearing appendix in the right mid abdomen. Vascular/Lymphatic: Mild atheromatous arterial calcifications. No enlarged lymph nodes. Reproductive: Normal sized prostate gland containing a coarse calcification. Other: Interval mildly elevated left hemidiaphragm. Musculoskeletal: Mild lumbar and lower thoracic spine degenerative changes. IMPRESSION: 1. Interval wedge-shaped low density in the mid and inferior left kidney without mass effect. This can be seen with renal infarction and  pyelonephritis. 2. 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 3. Stable small amount of fat in the anterior wall of the urinary bladder. This could be due to previous infection. Electronically Signed   By: Beckie Salts M.D.   On: 12/12/2014 14:38   I have personally reviewed and evaluated these images and lab results as part of my medical decision-making.   EKG Interpretation None      MDM   Final diagnoses:  Pyelonephritis   Initially had thought viral gastroenteritis as pt was not particularly tender on exam, short duration of symptoms. He felt improved with pain meds, anti-emetics, and fluid. However, given pt's white count elevated to 20.3 I ordered CT abd/pelv. CT showed wedge-shaped density concerning for renal infarction and pyelo. Given pt's white count I suspect pyelo is more likely, though pt does not have any urinary symptoms and his urine looks more dry rather than infected. Started on IV fluid resuscitation and IV cipro. Consulted hospitalist for admission.    Mario Coria, PA-C 12/12/14 2000  Mario Munch,  MD 12/13/14 [id number]

## 2014-12-12 NOTE — ED Notes (Signed)
RN currently starting IV 

## 2014-12-12 NOTE — ED Notes (Signed)
Patient transported to CT 

## 2014-12-12 NOTE — ED Notes (Signed)
Awake. Verbally responsive. A/O x4. Resp even and unlabored. No audible adventitious breath sounds noted. ABC's intact. IV SL patent and intact. Family at bedside.

## 2014-12-12 NOTE — ED Notes (Signed)
Pt. Is unable to urinate at this time.  

## 2014-12-12 NOTE — ED Notes (Signed)
Pt returned to room from CT scan

## 2014-12-12 NOTE — ED Notes (Signed)
Awake. Verbally responsive. A/O x4. Resp even and unlabored. No audible adventitious breath sounds noted. ABC's intact. IV saline lock patent and intact. Family at bedside. 

## 2014-12-12 NOTE — ED Notes (Signed)
Awake. Verbally responsive. A/O x4. Resp even and unlabored. No audible adventitious breath sounds noted. ABC's intact. IV infusing NS at 999ml/hr without difficulty. Family at bedside. 

## 2014-12-12 NOTE — ED Notes (Addendum)
Awake. Verbally responsive. A/O x4. Resp even and unlabored. No audible adventitious breath sounds noted. ABC's intact. IV saline lock patent and intact. Family at bedside. 

## 2014-12-12 NOTE — ED Notes (Signed)
Awake. Verbally responsive. A/O x4. Resp even and unlabored. No audible adventitious breath sounds noted. ABC's intact. Pt had no adverse reaction to ABT.

## 2014-12-13 DIAGNOSIS — R1030 Lower abdominal pain, unspecified: Secondary | ICD-10-CM | POA: Diagnosis not present

## 2014-12-13 DIAGNOSIS — I10 Essential (primary) hypertension: Secondary | ICD-10-CM | POA: Diagnosis not present

## 2014-12-13 DIAGNOSIS — N12 Tubulo-interstitial nephritis, not specified as acute or chronic: Secondary | ICD-10-CM | POA: Diagnosis not present

## 2014-12-13 DIAGNOSIS — A419 Sepsis, unspecified organism: Secondary | ICD-10-CM | POA: Diagnosis not present

## 2014-12-13 LAB — CBC
HEMATOCRIT: 40.1 % (ref 39.0–52.0)
Hemoglobin: 13.7 g/dL (ref 13.0–17.0)
MCH: 32.5 pg (ref 26.0–34.0)
MCHC: 34.2 g/dL (ref 30.0–36.0)
MCV: 95 fL (ref 78.0–100.0)
PLATELETS: 312 10*3/uL (ref 150–400)
RBC: 4.22 MIL/uL (ref 4.22–5.81)
RDW: 13.7 % (ref 11.5–15.5)
WBC: 18.3 10*3/uL — ABNORMAL HIGH (ref 4.0–10.5)

## 2014-12-13 LAB — BASIC METABOLIC PANEL
Anion gap: 7 (ref 5–15)
BUN: 18 mg/dL (ref 6–20)
CO2: 25 mmol/L (ref 22–32)
CREATININE: 1.03 mg/dL (ref 0.61–1.24)
Calcium: 8.8 mg/dL — ABNORMAL LOW (ref 8.9–10.3)
Chloride: 101 mmol/L (ref 101–111)
GFR calc Af Amer: >60 mL/min (ref >60)
GLUCOSE: 92 mg/dL (ref 65–99)
POTASSIUM: 3.7 mmol/L (ref 3.5–5.1)
Sodium: 133 mmol/L — ABNORMAL LOW (ref 135–145)

## 2014-12-13 LAB — GLUCOSE, CAPILLARY: Glucose-Capillary: 78 mg/dL (ref 65–99)

## 2014-12-13 NOTE — Progress Notes (Signed)
Patient ID: Mario Woodard, male   DOB: 1955-05-31, 59 y.o.   MRN: [id number] [facility] PROGRESS NOTE  Mario Woodard WIO:[id number] DOB: 07-15-1955 DOA: 12/12/2014 PCP: Kathlene November, MD  Brief narrative:    59 y.o. male with no significant past medical history who presented to [facility] ED with reports of ongoing abdominal pain, nausea and non bloody vomiting for past few days prior to this admission. Pt also had few episodes of diarrhea but has no diarrhea since arriving to ED. In regards to abdominal pain, pt reported sharp, intermittent pain in lower abdomen occasionally but not consistently radiating to the back. He has not taken anything for pain at home but analgesia in ED did improve the pain. No associated fevers.  In ED, pt was hemodynamically stable. Blood work demonstrated WBC count of 20.3. UA showed rare bacteria. CT abdomen concerning for pyelonephritis or renal infarction. Started rocephin (cipro given in ED) however per sepsis protocol rocephin is the abx of choice.  Anticipated discharge: 12/5 if urine cultures are back.   Assessment/Plan:    Principal Problem:  Sepsis ([place]) / Pyelonephritis / Leukocytosis / Abdominal pain - Sepsis criteria met on admission considering tachycardia, leukocytosis and source of infection being pyelonephritis which was seen on CT abdomen - Started on empiric rocephin as indicated above  - Lactic acid and procalcitonin WNL - Follow up urine culture results and blood culture results.  - Diet as tolerated   Active Problems:  Benign essential HTN - Continue Norvasc  - BP controlled, 125/67  DVT prophylaxis: - SCD's bilaterally in hospital   Code Status: Full.  Family Communication:  plan of care discussed with the patient Disposition Plan: Home 12/5 if urine cultures back.  IV access:  Peripheral IV  Procedures and diagnostic studies:    Ct Abdomen Pelvis W Contrast 12/12/2014  1. Interval wedge-shaped low density in the mid and inferior left  kidney without mass effect. This can be seen with renal infarction and pyelonephritis. 2. 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 3. Stable small amount of fat in the anterior wall of the urinary bladder. This could be due to previous infection. Electronically Signed   By: Claudie Revering M.D.   On: 12/12/2014 14:38   Medical Consultants:  None   Other Consultants:  None   IAnti-Infectives:   Rocephin 12/12/2014 -->   Leisa Lenz, MD  [facility] Pager [phone]  Time spent in minutes: 25 minutes  If 7PM-7AM, please contact night-coverage [email or web address] Password [email or web address] 12/13/2014, 9:00 AM   LOS: 1 day    HPI/Subjective: No acute overnight events. Patient reports pain is about 5/10 in lower abdomen but better with analgesia.   Objective: Filed Vitals:   12/12/14 1600 12/12/14 1700 12/12/14 2100 12/13/14 0513  BP: 182/105 166/100 161/95 125/67  Pulse:  58 87 77  Temp:  98 F (36.7 C) 98.2 F (36.8 C) 98.7 F (37.1 C)  TempSrc:  Oral Oral Oral  Resp:  '18 18 18  ' Height:  '5\' 11"'  (1.803 m)    Weight:  94.7 kg (208 lb 12.4 oz)  94.666 kg (208 lb 11.2 oz)  SpO2:  94% 91% 96%    Intake/Output Summary (Last 24 hours) at 12/13/14 0900 Last data filed at 12/12/14 1710  Gross per 24 hour  Intake      0 ml  Output    250 ml  Net   -250 ml    Exam:   General:  Pt is alert, follows  commands appropriately, not in acute distress  Cardiovascular: Regular rate and rhythm, S1/S2, no murmurs  Respiratory: Clear to auscultation bilaterally, no wheezing, no crackles, no rhonchi  Abdomen: Soft, non tender, non distended, bowel sounds present  Extremities: No edema, pulses DP and PT palpable bilaterally  Neuro: Grossly nonfocal  Data Reviewed: Basic Metabolic Panel:  Recent Labs Lab 12/12/14 1110 12/13/14 0550  NA 134* 133*  K 4.3 3.7  CL 98* 101  CO2 27 25  GLUCOSE 112* 92  BUN 26* 18  CREATININE 1.18 1.03  CALCIUM 9.7 8.8*   Liver Function  Tests:  Recent Labs Lab 12/12/14 1110  AST 30  ALT 18  ALKPHOS 68  BILITOT 1.4*  PROT 8.1  ALBUMIN 4.4    Recent Labs Lab 12/12/14 1110  LIPASE 23   No results for input(s): AMMONIA in the last 168 hours. CBC:  Recent Labs Lab 12/12/14 1225 12/13/14 0550  WBC 20.3* 18.3*  HGB 15.3 13.7  HCT 44.0 40.1  MCV 94.0 95.0  PLT 339 312   Cardiac Enzymes: No results for input(s): CKTOTAL, CKMB, CKMBINDEX, TROPONINI in the last 168 hours. BNP: Invalid input(s): POCBNP CBG:  Recent Labs Lab 12/13/14 0743  GLUCAP 78    No results found for this or any previous visit (from the past 240 hour(s)).   Scheduled Meds: . amLODipine  5 mg Oral Daily  . cefTRIAXone (ROCEPHIN)  IV  2 g Intravenous Q24H  . Influenza vac split quadrivalent PF  0.5 mL Intramuscular Tomorrow-1000  . pneumococcal 23 valent vaccine  0.5 mL Intramuscular Tomorrow-1000   Continuous Infusions: . sodium chloride 75 mL/hr at 12/12/14 1721

## 2014-12-14 DIAGNOSIS — N12 Tubulo-interstitial nephritis, not specified as acute or chronic: Secondary | ICD-10-CM | POA: Diagnosis not present

## 2014-12-14 DIAGNOSIS — A419 Sepsis, unspecified organism: Secondary | ICD-10-CM | POA: Diagnosis not present

## 2014-12-14 DIAGNOSIS — D72829 Elevated white blood cell count, unspecified: Secondary | ICD-10-CM | POA: Diagnosis not present

## 2014-12-14 DIAGNOSIS — I10 Essential (primary) hypertension: Secondary | ICD-10-CM | POA: Diagnosis not present

## 2014-12-14 LAB — URINE CULTURE: CULTURE: NO GROWTH

## 2014-12-14 LAB — CBC
HCT: 38.4 % — ABNORMAL LOW (ref 39.0–52.0)
Hemoglobin: 13.3 g/dL (ref 13.0–17.0)
MCH: 32.8 pg (ref 26.0–34.0)
MCHC: 34.6 g/dL (ref 30.0–36.0)
MCV: 94.6 fL (ref 78.0–100.0)
PLATELETS: 299 10*3/uL (ref 150–400)
RBC: 4.06 MIL/uL — AB (ref 4.22–5.81)
RDW: 13.5 % (ref 11.5–15.5)
WBC: 15 10*3/uL — AB (ref 4.0–10.5)

## 2014-12-14 LAB — GLUCOSE, CAPILLARY: Glucose-Capillary: 94 mg/dL (ref 65–99)

## 2014-12-14 MED ORDER — PNEUMOCOCCAL VAC POLYVALENT 25 MCG/0.5ML IJ INJ: 0.5000 mL | INJECTION | INTRAMUSCULAR | Status: DC

## 2014-12-14 MED ORDER — CIPROFLOXACIN HCL 500 MG PO TABS: 500.0000 mg | ORAL_TABLET | Freq: Two times a day (BID) | ORAL | Status: AC

## 2014-12-14 MED ORDER — INFLUENZA VAC SPLIT QUAD 0.5 ML IM SUSY
0.5000 mL | PREFILLED_SYRINGE | INTRAMUSCULAR | Status: AC
  Administered 2014-12-14: 0.5 mL via INTRAMUSCULAR
  Filled 2014-12-14: qty 0.5

## 2014-12-14 MED ORDER — AMLODIPINE BESYLATE 5 MG PO TABS: 5.0000 mg | ORAL_TABLET | Freq: Every day | ORAL | Status: AC

## 2014-12-14 NOTE — Discharge Instructions (Addendum)
Urinary Tract Infection Urinary tract infections (UTIs) can develop anywhere along your urinary tract. Your urinary tract is your body's drainage system for removing wastes and extra water. Your urinary tract includes two kidneys, two ureters, a bladder, and a urethra. Your kidneys are a pair of bean-shaped organs. Each kidney is about the size of your fist. They are located below your ribs, one on each side of your spine. CAUSES Infections are caused by microbes, which are microscopic organisms, including fungi, viruses, and bacteria. These organisms are so small that they can only be seen through a microscope. Bacteria are the microbes that most commonly cause UTIs. SYMPTOMS  Symptoms of UTIs may vary by age and gender of the patient and by the location of the infection. Symptoms in young women typically include a frequent and intense urge to urinate and a painful, burning feeling in the bladder or urethra during urination. Older women and men are more likely to be tired, shaky, and weak and have muscle aches and abdominal pain. A fever may mean the infection is in your kidneys. Other symptoms of a kidney infection include pain in your back or sides below the ribs, nausea, and vomiting. DIAGNOSIS To diagnose a UTI, your caregiver will ask you about your symptoms. Your caregiver will also ask you to provide a urine sample. The urine sample will be tested for bacteria and white blood cells. White blood cells are made by your body to help fight infection. TREATMENT  Typically, UTIs can be treated with medication. Because most UTIs are caused by a bacterial infection, they usually can be treated with the use of antibiotics. The choice of antibiotic and length of treatment depend on your symptoms and the type of bacteria causing your infection. HOME CARE INSTRUCTIONS  If you were prescribed antibiotics, take them exactly as your caregiver instructs you. Finish the medication even if you feel better after  you have only taken some of the medication.  Drink enough water and fluids to keep your urine clear or pale yellow.  Avoid caffeine, tea, and carbonated beverages. They tend to irritate your bladder.  Empty your bladder often. Avoid holding urine for long periods of time.  Empty your bladder before and after sexual intercourse.  After a bowel movement, women should cleanse from front to back. Use each tissue only once. SEEK MEDICAL CARE IF:   You have back pain.  You develop a fever.  Your symptoms do not begin to resolve within 3 days. SEEK IMMEDIATE MEDICAL CARE IF:   You have severe back pain or lower abdominal pain.  You develop chills.  You have nausea or vomiting.  You have continued burning or discomfort with urination. MAKE SURE YOU:   Understand these instructions.  Will watch your condition.  Will get help right away if you are not doing well or get worse.   This information is not intended to replace advice given to you by your health care provider. Make sure you discuss any questions you have with your health care provider.   Document Released: 10/05/2004 Document Revised: 09/16/2014 Document Reviewed: 02/03/2011 Elsevier Interactive Patient Education 2016 Else[employer] Inc[employer] Pyelonephritis, Adult Pyelonephritis is a kidney infection. The kidneys are the organs that filter a person's blood and move waste out of the bloodstream and into the urine. Urine passes from the kidneys, through the ureters, and into the bladder. There are two main types of pyelonephritis:  Infections that come on quickly without any warning (acute pyelonephritis).  Infections that  last for a long period of time (chronic pyelonephritis). In most cases, the infection clears up with treatment and does not cause further problems. More severe infections or chronic infections can sometimes spread to the bloodstream or lead to other problems with the kidneys. CAUSES This condition is usually  caused by:  Bacteria traveling from the bladder to the kidney through infected urine. The urine in the bladder can become infected with bacteria from:  Bladder infection (cystitis).  Inflammation of the prostate gland (prostatitis).  Sexual intercourse, in females.  Bacteria traveling from the bloodstream to the kidney. RISK FACTORS This condition is more likely to develop in:  Pregnant women.  Older people.  People who have diabetes.  People who have kidney stones or bladder stones.  People who have other abnormalities of the kidney or ureter.  People who have a catheter placed in the bladder.  People who have cancer.  People who are sexually active.  Women who use spermicides.  People who have had a prior urinary tract infection. SYMPTOMS Symptoms of this condition include:  Frequent urination.  Strong or persistent urge to urinate.  Burning or stinging when urinating.  Abdominal pain.  Back pain.  Pain in the side or flank area.  Fever.  Chills.  Blood in the urine, or dark urine.  Nausea.  Vomiting. DIAGNOSIS This condition may be diagnosed based on:  Medical history and physical exam.  Urine tests.  Blood tests. You may also have imaging tests of the kidneys, such as an ultrasound or CT scan. TREATMENT Treatment for this condition may depend on the severity of the infection.  If the infection is mild and is found early, you may be treated with antibiotic medicines taken by mouth. You will need to drink fluids to remain hydrated.  If the infection is more severe, you may need to stay in the hospital and receive antibiotics given directly into a vein through an IV tube. You may also need to receive fluids through an IV tube if you are not able to remain hydrated. After your hospital stay, you may need to take oral antibiotics for a period of time. Other treatments may be required, depending on the cause of the infection. HOME CARE  INSTRUCTIONS Medicines  Take over-the-counter and prescription medicines only as told by your health care provider.  If you were prescribed an antibiotic medicine, take it as told by your health care provider. Do not stop taking the antibiotic even if you start to feel better. General Instructions  Drink enough fluid to keep your urine clear or pale yellow.  Avoid caffeine, tea, and carbonated beverages. They tend to irritate the bladder.  Urinate often. Avoid holding in urine for long periods of time.  Urinate before and after sex.  After a bowel movement, women should cleanse from front to back. Use each tissue only once.  Keep all follow-up visits as told by your health care provider. This is important. SEEK MEDICAL CARE IF:  Your symptoms do not get better after 2 days of treatment.  Your symptoms get worse.  You have a fever. SEEK IMMEDIATE MEDICAL CARE IF:  You are unable to take your antibiotics or fluids.  You have shaking chills.  You vomit.  You have severe flank or back pain.  You have extreme weakness or fainting.   This information is not intended to replace advice given to you by your health care provider. Make sure you discuss any questions you have with your health  care provider.   Document Released: 12/26/2004 Document Revised: 09/16/2014 Document Reviewed: 04/20/2014 Elsevier Interactive Patient Education [employer]2016 Else[employer]

## 2014-12-14 NOTE — Discharge Summary (Signed)
Physician Discharge Summary  Mario Woodard FEO:[id number] DOB: [birth year] DOA: 12/12/2014  PCP: Kathlene November, MD  Admit date: 12/12/2014 Discharge date: 12/14/2014  Recommendations for Outpatient Follow-up:  1. Patient will continue ciprofloxacin for 7 days on discharge to complete the treatment for pyelonephritis.  Discharge Diagnoses:  Principal Problem:   Sepsis ([place]) Active Problems:   Pyelonephritis   Leukocytosis   Abdominal pain   Benign essential HTN    Discharge Condition: stable   Diet recommendation: as tolerated   History of present illness:  59 y.o. male with no significant past medical history who presented to [facility] ED with reports of ongoing abdominal pain, nausea and non bloody vomiting for past few days prior to this admission. Pt also had few episodes of diarrhea but has no diarrhea since arriving to ED. In regards to abdominal pain, pt reported sharp, intermittent pain in lower abdomen occasionally but not consistently radiating to the back. He has not taken anything for pain at home but analgesia in ED did improve the pain. No associated fevers.  In ED, pt was hemodynamically stable. Blood work demonstrated WBC count of 20.3. UA showed rare bacteria. CT abdomen concerning for pyelonephritis or renal infarction. Started rocephin (cipro given in ED) however per sepsis protocol rocephin is the abx of choice.  Hospital Course:   Assessment/Plan:    Principal Problem:  Sepsis ([place]) / Pyelonephritis / Leukocytosis / Abdominal pain - Sepsis criteria met on admission considering tachycardia, leukocytosis and source of infection being pyelonephritis which was seen on CT abdomen - Started on empiric rocephin as indicated above - Urine culture showed no growth (called and confirmed with lab) - Will continue cipro for 7 days on discharge   - Lactic acid and procalcitonin WNL - Tolerated diet  Active Problems:  Benign essential HTN - Continue Norvasc   DVT  prophylaxis: - SCD's bilaterally  Code Status: Full.  Family Communication: plan of care discussed with the patient    IV access:  Peripheral IV  Procedures and diagnostic studies:   Ct Abdomen Pelvis W Contrast 12/12/2014 1. Interval wedge-shaped low density in the mid and inferior left kidney without mass effect. This can be seen with renal infarction and pyelonephritis. 2. 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 3. Stable small amount of fat in the anterior wall of the urinary bladder. This could be due to previous infection. Electronically Signed By: Claudie Revering M.D. On: 12/12/2014 14:38   Medical Consultants:  None   Other Consultants:  None   IAnti-Infectives:   Rocephin 12/12/2014 --> 12/14/2014    Signed:  Leisa Lenz, MD  [facility] 12/14/2014, 9:40 AM  Pager #: [phone]  Time spent in minutes: less than 30 minutes   Discharge Exam: Filed Vitals:   12/13/14 2125 12/14/14 0628  BP: 140/84 140/98  Pulse: 88 77  Temp: 98.8 F (37.1 C) 98.3 F (36.8 C)  Resp: 18 18   Filed Vitals:   12/13/14 0513 12/13/14 1417 12/13/14 2125 12/14/14 0628  BP: 125/67 142/83 140/84 140/98  Pulse: 77 79 88 77  Temp: 98.7 F (37.1 C) 98.2 F (36.8 C) 98.8 F (37.1 C) 98.3 F (36.8 C)  TempSrc: Oral Oral Oral Oral  Resp: _0 Height:      Weight: 94.666 kg (208 lb 11.2 oz)   97.024 kg (213 lb 14.4 oz)  SpO2: 96% 94% 95% 97%    General: Pt is alert, follows commands appropriately, not in acute distress Cardiovascular:  Regular rate and rhythm, S1/S2 +, no murmurs Respiratory: Clear to auscultation bilaterally, no wheezing, no crackles, no rhonchi Abdominal: Soft, non tender, non distended, bowel sounds +, no guarding Extremities: no edema, no cyanosis, pulses palpable bilaterally DP and PT Neuro: Grossly nonfocal  Discharge Instructions  Discharge Instructions    Call MD for:  difficulty breathing, headache or  visual disturbances    Complete by:  As directed      Call MD for:  persistant nausea and vomiting    Complete by:  As directed      Call MD for:  severe uncontrolled pain    Complete by:  As directed      Diet - low sodium heart healthy    Complete by:  As directed      Discharge instructions    Complete by:  As directed   Continue Cipro for 7 days on discharge for urinary tract infection.     Increase activity slowly    Complete by:  As directed             Medication List    STOP taking these medications        ondansetron 4 MG tablet  Commonly known as:  ZOFRAN     oxyCODONE-acetaminophen 5-325 MG tablet  Commonly known as:  PERCOCET/ROXICET      TAKE these medications        amLODipine 5 MG tablet  Commonly known as:  NORVASC  Take 1 tablet (5 mg total) by mouth daily.     ciprofloxacin 500 MG tablet  Commonly known as:  CIPRO  Take 1 tablet (500 mg total) by mouth 2 (two) times daily.           Follow-up Information    Follow up with Kathlene November, MD. Schedule an appointment as soon as possible for a visit in 2 weeks.   Specialty:  Internal Medicine   Why:  Follow up appt after recent hospitalization   Contact information:   [place] [id number] [phone]        The results of significant diagnostics from this hospitalization (including imaging, microbiology, ancillary and laboratory) are listed below for reference.    Significant Diagnostic Studies: Ct Abdomen Pelvis W Contrast  12/12/2014  CLINICAL DATA:  Lower pelvic pain for the past 3 days. Nausea and vomiting. Leukocytosis. EXAM: CT ABDOMEN AND PELVIS WITH CONTRAST TECHNIQUE: Multidetector CT imaging of the abdomen and pelvis was performed using the standard protocol following bolus administration of intravenous contrast. CONTRAST:  125m OMNIPAQUE IOHEXOL 300 MG/ML  SOLN COMPARISON:  03/19/2014. FINDINGS: Lower chest:  Clear lung bases. Hepatobiliary: No masses or  other significant abnormality. Pancreas: No mass, inflammatory changes, or other significant abnormality. Spleen: Small and lobulated, unchanged. Adrenals/Urinary Tract: Interval wedge-shaped low density in the mid and inferior portions of the left kidney with no mass effect. A small lower pole right renal cyst is unchanged. The left adrenal gland remains prominent with no discrete mass. The right adrenal gland continues to have a normal appearance. Unremarkable ureters. A small amount of fat in the anterior wall of the urinary bladder is unchanged. Stomach/Bowel: No gastrointestinal abnormalities. Normal appearing appendix in the right mid abdomen. Vascular/Lymphatic: Mild atheromatous arterial calcifications. No enlarged lymph nodes. Reproductive: Normal sized prostate gland containing a coarse calcification. Other: Interval mildly elevated left hemidiaphragm. Musculoskeletal: Mild lumbar and lower thoracic spine degenerative changes. IMPRESSION: 1. Interval wedge-shaped low density in the mid  and inferior left kidney without mass effect. This can be seen with renal infarction and pyelonephritis. 2. 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 3. Stable small amount of fat in the anterior wall of the urinary bladder. This could be due to previous infection. Electronically Signed   By: Claudie Revering M.D.   On: 12/12/2014 14:38    Microbiology: Recent Results (from the past 240 hour(s))  Urine culture     Status: None   Collection Time: 12/12/14  5:12 PM  Result Value Ref Range Status   Specimen Description URINE, CLEAN CATCH  Final   Special Requests NONE  Final   Culture   Final    NO GROWTH 1 DAY Performed at [facility]    Report Status 12/14/2014 FINAL  Final     Labs: Basic Metabolic Panel:  Recent Labs Lab 12/12/14 1110 12/13/14 0550  NA 134* 133*  K 4.3 3.7  CL 98* 101  CO2 27 25  GLUCOSE 112* 92  BUN 26* 18  CREATININE 1.18 1.03  CALCIUM 9.7 8.8*   Liver  Function Tests:  Recent Labs Lab 12/12/14 1110  AST 30  ALT 18  ALKPHOS 68  BILITOT 1.4*  PROT 8.1  ALBUMIN 4.4    Recent Labs Lab 12/12/14 1110  LIPASE 23   No results for input(s): AMMONIA in the last 168 hours. CBC:  Recent Labs Lab 12/12/14 1225 12/13/14 0550 12/14/14 0847  WBC 20.3* 18.3* 15.0*  HGB 15.3 13.7 13.3  HCT 44.0 40.1 38.4*  MCV 94.0 95.0 94.6  PLT 339 312 299   Cardiac Enzymes: No results for input(s): CKTOTAL, CKMB, CKMBINDEX, TROPONINI in the last 168 hours. BNP: BNP (last 3 results) No results for input(s): BNP in the last 8760 hours.  ProBNP (last 3 results) No results for input(s): PROBNP in the last 8760 hours.  CBG:  Recent Labs Lab 12/13/14 0743 12/14/14 0746  GLUCAP 78 94

## 2014-12-16 ENCOUNTER — Telehealth: Payer: Self-pay | Admitting: Behavioral Health

## 2014-12-16 NOTE — Telephone Encounter (Signed)
Unable to reach patient at time of TCM/Hospital Follow-up call. The patient's voice mailbox is full; could not leave a message.

## 2014-12-18 LAB — CULTURE, BLOOD (ROUTINE X 2)
CULTURE: NO GROWTH
CULTURE: NO GROWTH

## 2014-12-30 ENCOUNTER — Ambulatory Visit: Payer: Commercial Managed Care - HMO | Admitting: Internal Medicine

## 2014-12-31 ENCOUNTER — Telehealth: Payer: Self-pay | Admitting: Internal Medicine

## 2014-12-31 NOTE — Telephone Encounter (Signed)
Pt was no show 12/30/14 11:30am for new pt/re-establish appt, I do not see tel note that it was approved, pt has not rescheduled.  1. Should pt be re-established? 2. Charge or no charge?

## 2015-01-01 NOTE — Telephone Encounter (Signed)
Forwarded to Dr. Paz 

## 2015-01-01 NOTE — Telephone Encounter (Signed)
Charge, thx

## 2017-07-17 IMAGING — CT CT ABD-PELV W/ CM
2 of 5 series · 16 of 46 positions shown, 18 images · IV contrast (OMNIPAQUE 300)
Comparison: 03/19/2014.

CLINICAL DATA: Lower pelvic pain for the past 3 days. Nausea and
vomiting. Leukocytosis.

EXAM:
CT ABDOMEN AND PELVIS WITH CONTRAST
TECHNIQUE: Multidetector CT imaging of the abdomen and pelvis was performed
using the standard protocol following bolus administration of
intravenous contrast.
CONTRAST:  100mL OMNIPAQUE IOHEXOL 300 MG/ML  SOLN

[Series 2: abd/pel with · axial · 0.82mm/px · z∈[-454,-48]mm · 13 of 91 slices shown, 15 images]
[im 5/91  soft-tissue]
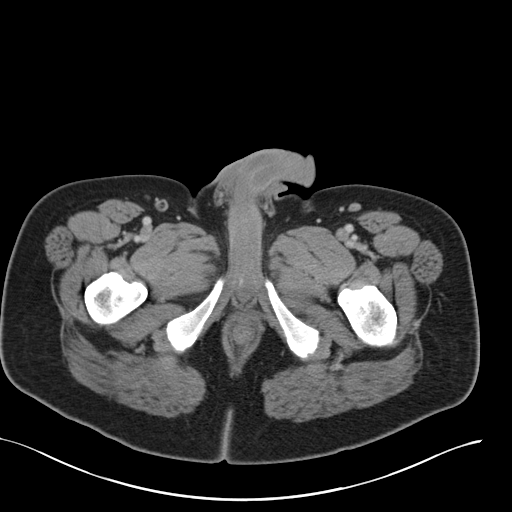
[im 5/91  bone]
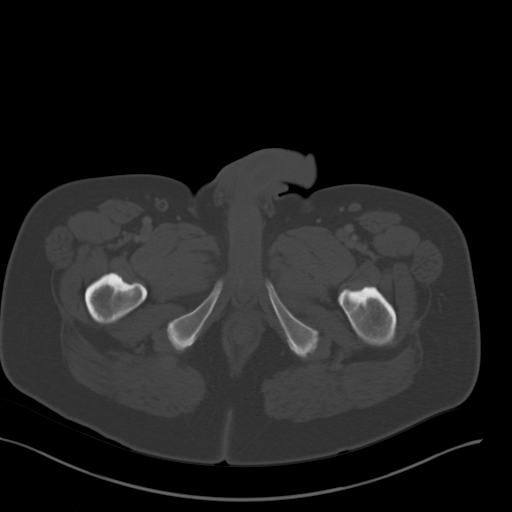
[im 15/91  soft-tissue]
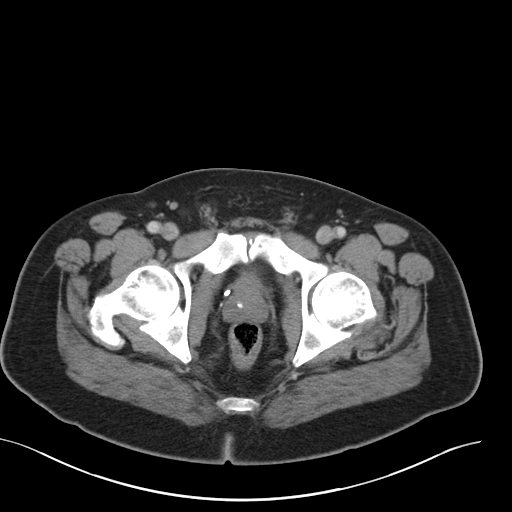
[im 19/91  soft-tissue]
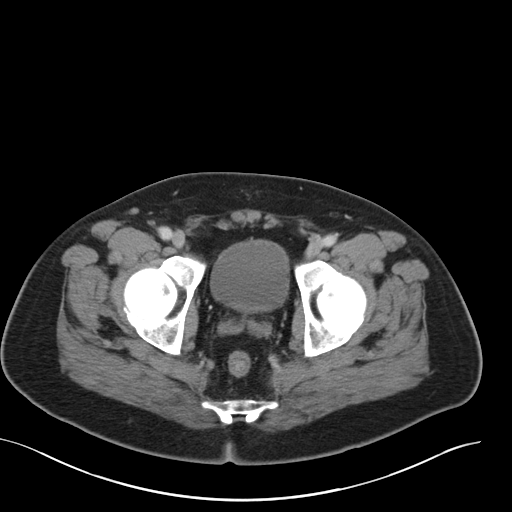
[im 24/91  soft-tissue]
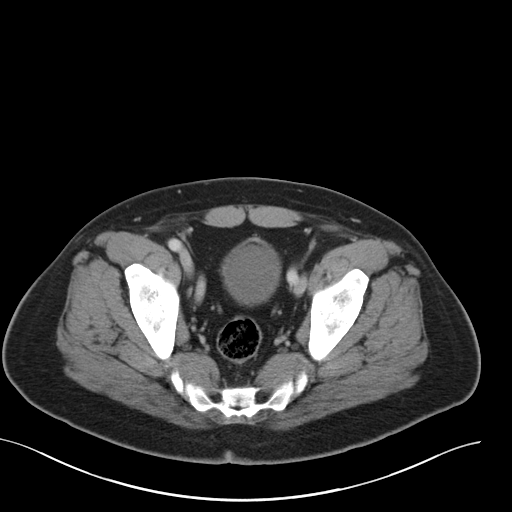
[im 34/91  soft-tissue]
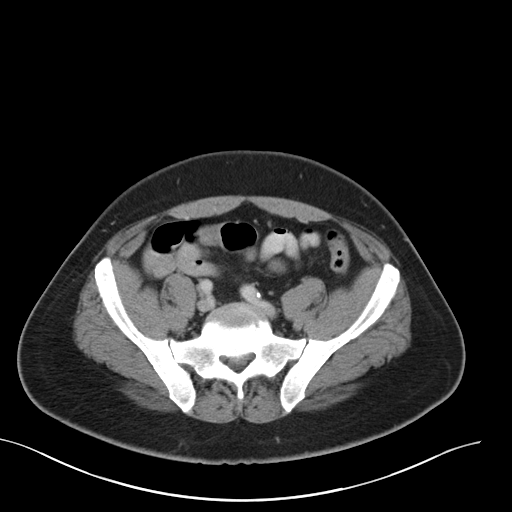
[im 38/91  soft-tissue]
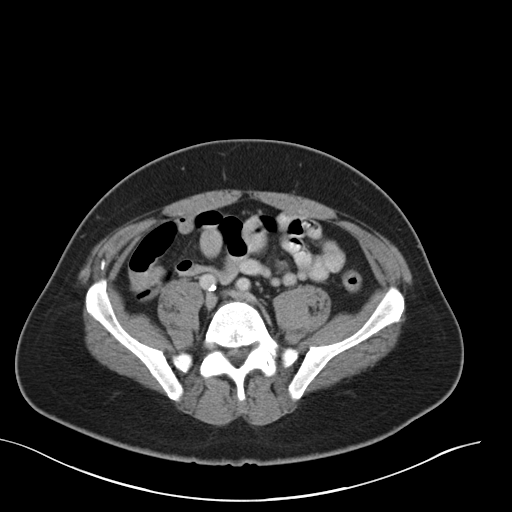
[im 48/91  soft-tissue]
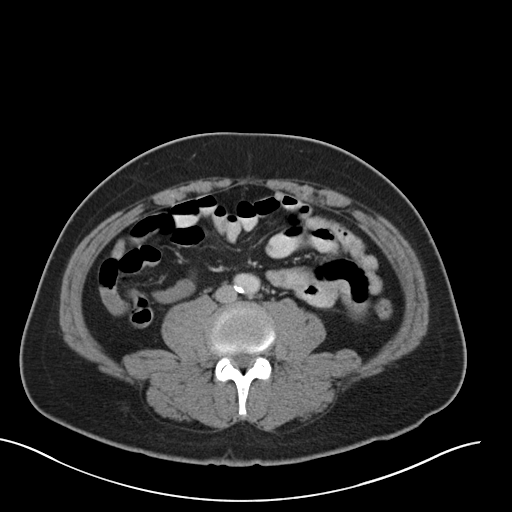
[im 53/91  soft-tissue]
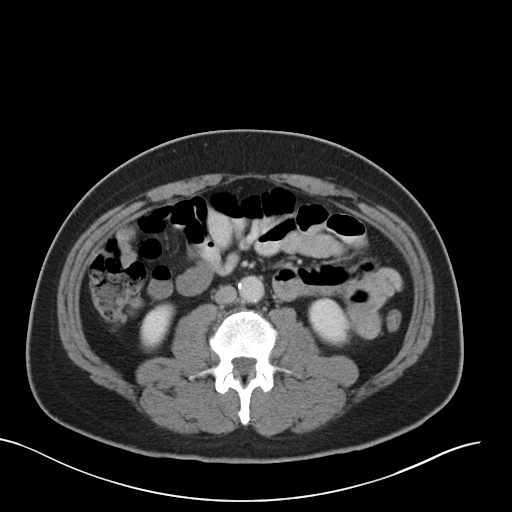
[im 57/91  soft-tissue]
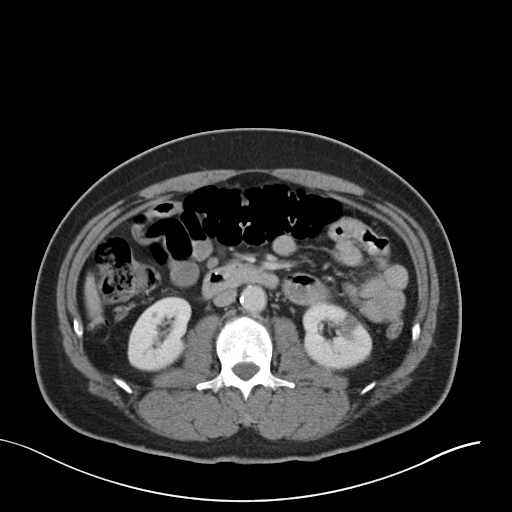
[im 57/91  bone]
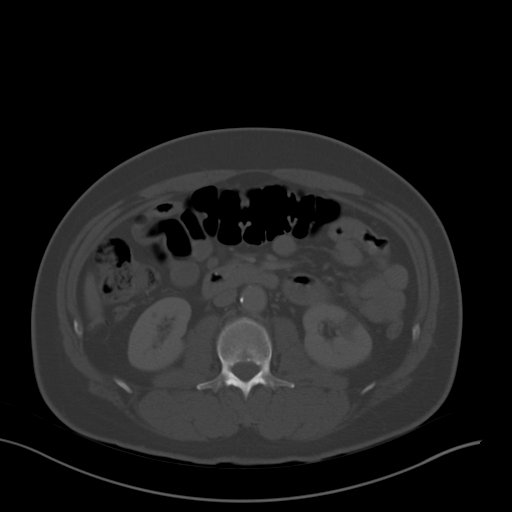
[im 67/91  soft-tissue]
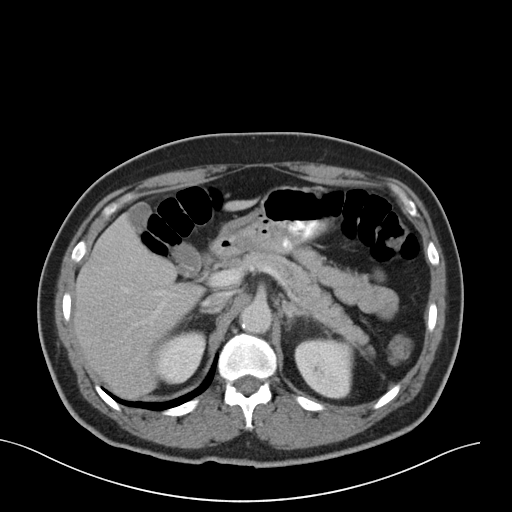
[im 72/91  soft-tissue]
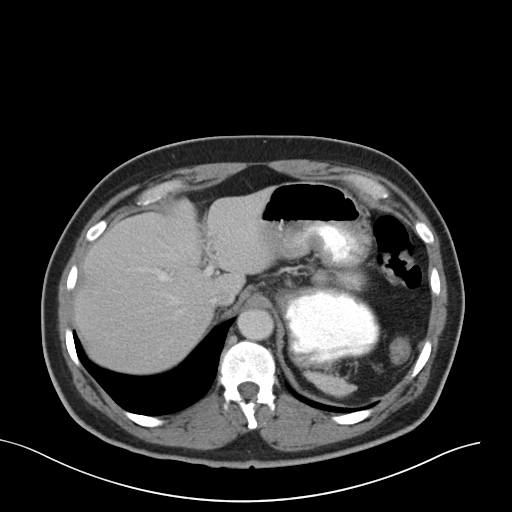
[im 76/91  soft-tissue]
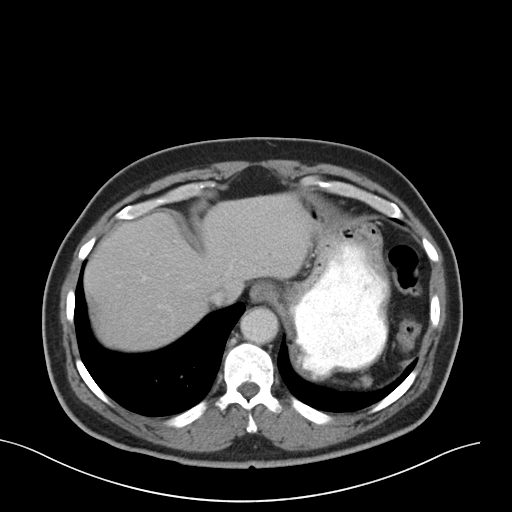
[im 86/91  soft-tissue]
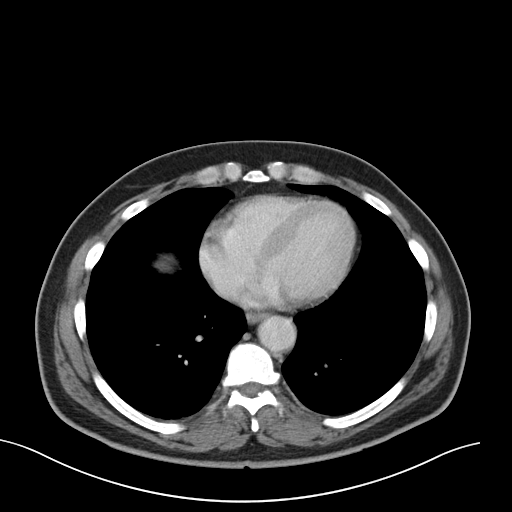

[Series 4: coronal a/|p · coronal · 0.74mm/px · 3 of 118 slices shown]
[im 40/118  soft-tissue]
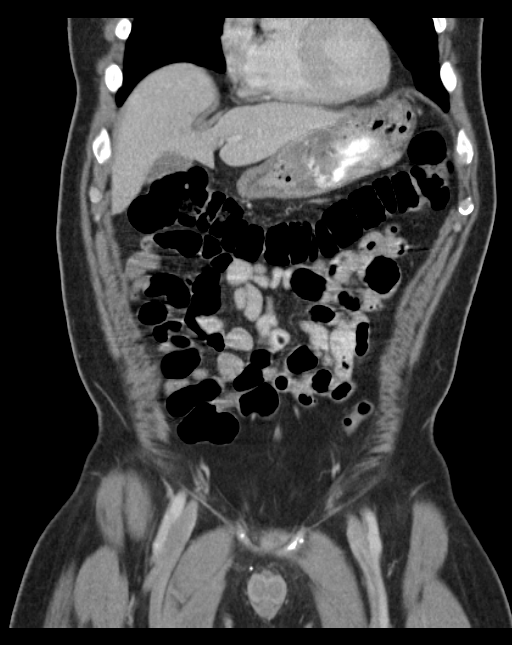
[im 53/118  soft-tissue]
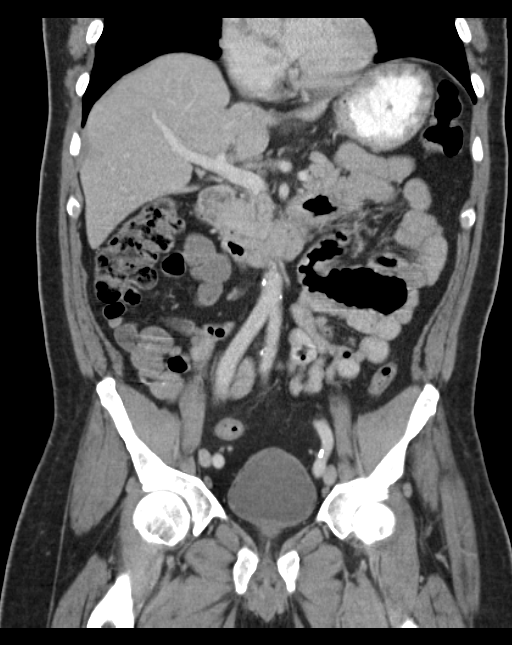
[im 66/118  soft-tissue]
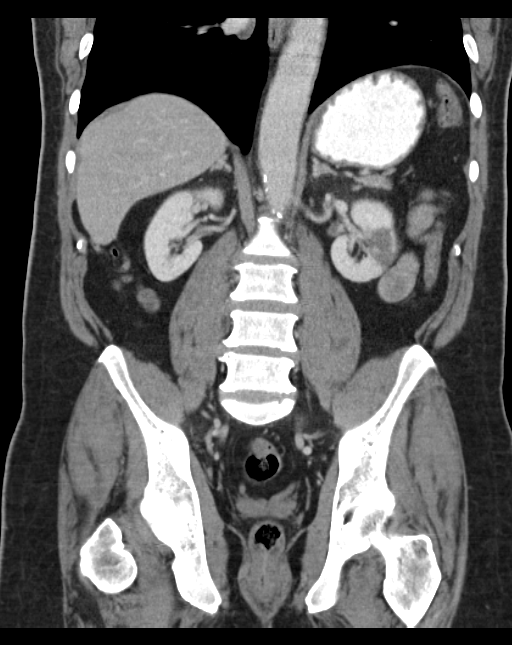

[16 of 46 positions shown; findings below may reference images not displayed]

FINDINGS: Lower chest:  Clear lung bases.

Hepatobiliary: No masses or other significant abnormality.

Pancreas: No mass, inflammatory changes, or other significant
abnormality.

Spleen: Small and lobulated, unchanged.

Adrenals/Urinary Tract: Interval wedge-shaped low density in the mid
and inferior portions of the left kidney with no mass effect. A
small lower pole right renal cyst is unchanged. The left adrenal
gland remains prominent with no discrete mass. The right adrenal
gland continues to have a normal appearance. Unremarkable ureters. A
small amount of fat in the anterior wall of the urinary bladder is
unchanged.

Stomach/Bowel: No gastrointestinal abnormalities. Normal appearing
appendix in the right mid abdomen.

Vascular/Lymphatic: Mild atheromatous arterial calcifications. No
enlarged lymph nodes.

Reproductive: Normal sized prostate gland containing a coarse
calcification.

Other: Interval mildly elevated left hemidiaphragm.

Musculoskeletal: Mild lumbar and lower thoracic spine degenerative
changes.
IMPRESSION: 1. Interval wedge-shaped low density in the mid and inferior left
kidney without mass effect. This can be seen with renal infarction
and pyelonephritis.
2. Stable mild left adrenal hyperplasia without focal mass.
3. Stable small amount of fat in the anterior wall of the urinary
bladder. This could be due to previous infection.

## 2018-12-02 ENCOUNTER — Other Ambulatory Visit: Payer: Self-pay

## 2018-12-02 DIAGNOSIS — Z20822 Contact with and (suspected) exposure to covid-19: Secondary | ICD-10-CM

## 2018-12-04 LAB — NOVEL CORONAVIRUS, NAA: SARS-CoV-2, NAA: NOT DETECTED

## 2019-01-14 ENCOUNTER — Other Ambulatory Visit: Payer: Commercial Managed Care - HMO

## 2019-01-16 ENCOUNTER — Ambulatory Visit: Payer: 59 | Attending: Internal Medicine

## 2019-01-16 DIAGNOSIS — Z20822 Contact with and (suspected) exposure to covid-19: Secondary | ICD-10-CM

## 2019-01-18 LAB — NOVEL CORONAVIRUS, NAA: SARS-CoV-2, NAA: NOT DETECTED

## 2019-01-21 ENCOUNTER — Ambulatory Visit: Payer: 59 | Attending: Internal Medicine

## 2019-01-21 DIAGNOSIS — Z20822 Contact with and (suspected) exposure to covid-19: Secondary | ICD-10-CM

## 2019-01-23 LAB — NOVEL CORONAVIRUS, NAA: SARS-CoV-2, NAA: DETECTED — AB
